# Patient Record
Sex: Male | Born: 1985 | Race: White | Hispanic: No | Marital: Married | Smoking: Former smoker
Health system: Southern US, Community
[De-identification: ages and names within clinical notes are randomized; demographics above are authoritative.]

## PROBLEM LIST (undated history)

## (undated) DIAGNOSIS — K219 Gastro-esophageal reflux disease without esophagitis: Secondary | ICD-10-CM

## (undated) HISTORY — DX: Gastro-esophageal reflux disease without esophagitis: K21.9

---

## 2009-03-09 ENCOUNTER — Emergency Department (HOSPITAL_COMMUNITY): Admission: EM | Admit: 2009-03-09 | Discharge: 2009-03-09 | Payer: Self-pay | Admitting: Emergency Medicine

## 2009-05-10 ENCOUNTER — Ambulatory Visit: Payer: Self-pay | Admitting: Family Medicine

## 2009-05-10 DIAGNOSIS — M5380 Other specified dorsopathies, site unspecified: Secondary | ICD-10-CM | POA: Insufficient documentation

## 2009-06-13 ENCOUNTER — Encounter (INDEPENDENT_AMBULATORY_CARE_PROVIDER_SITE_OTHER): Payer: Self-pay | Admitting: *Deleted

## 2009-06-13 DIAGNOSIS — F17200 Nicotine dependence, unspecified, uncomplicated: Secondary | ICD-10-CM | POA: Insufficient documentation

## 2010-07-11 NOTE — Miscellaneous (Signed)
Summary: [name]  Clinical Lists Changes  Problems: Added new problem of [name] (ICD-305.1) 

## 2010-07-25 ENCOUNTER — Encounter: Payer: Self-pay | Admitting: *Deleted

## 2010-09-15 LAB — CULTURE, ROUTINE-ABSCESS

## 2011-07-11 ENCOUNTER — Other Ambulatory Visit: Payer: Self-pay | Admitting: Family Medicine

## 2018-06-24 ENCOUNTER — Ambulatory Visit (INDEPENDENT_AMBULATORY_CARE_PROVIDER_SITE_OTHER): Payer: 59 | Admitting: Family Medicine

## 2018-06-24 ENCOUNTER — Encounter: Payer: Self-pay | Admitting: Family Medicine

## 2018-06-24 VITALS — BP 122/72 | HR 72 | Temp 98.5°F | Ht 76.0 in | Wt 200.2 lb

## 2018-06-24 DIAGNOSIS — Z0000 Encounter for general adult medical examination without abnormal findings: Secondary | ICD-10-CM | POA: Diagnosis not present

## 2018-06-24 NOTE — Progress Notes (Signed)
Subjective:  Eben Bezio is a 33 y.o. male who presents today for his annual comprehensive physical exam and to establish care.    HPI:  He has no acute complaints today.   Lifestyle Diet: No specific diets.  Exercise: Walks dog regularly.   Depression screen PHQ 2/9 06/24/2018  Decreased Interest 0  Down, Depressed, Hopeless 0  PHQ - 2 Score 0   Health Maintenance Due  Topic Date Due  . HIV Screening  03/28/2001    ROS: A complete review of systems was negative.   PMH:  The following were reviewed and entered/updated in epic: Past Medical History:  Diagnosis Date  . GERD (gastroesophageal reflux disease)    There are no active problems to display for this patient.  History reviewed. No pertinent surgical history.  Family History  Problem Relation Age of Onset  . Diabetes Mother   . Arthritis Father   . Hearing loss Father   . Hypertension Father   . Prostate cancer Neg Hx   . Colon cancer Neg Hx     Medications- reviewed and updated No current outpatient medications on file.   No current facility-administered medications for this visit.     Allergies-reviewed and updated Allergies  Allergen Reactions  . Sulfa Antibiotics Swelling    Social History   Socioeconomic History  . Marital status: Married    Spouse name: Not on file  . Number of children: Not on file  . Years of education: Not on file  . Highest education level: Not on file  Occupational History  . Not on file  Social Needs  . Financial resource strain: Not on file  . Food insecurity:    Worry: Not on file    Inability: Not on file  . Transportation needs:    Medical: Not on file    Non-medical: Not on file  Tobacco Use  . Smoking status: Former Smoker    Types: Cigarettes    Last attempt to quit: 06/24/2013    Years since quitting: 5.0  . Smokeless tobacco: Never Used  Substance and Sexual Activity  . Alcohol use: Yes  . Drug use: Never  . Sexual activity: Yes  Lifestyle   . Physical activity:    Days per week: Not on file    Minutes per session: Not on file  . Stress: Not on file  Relationships  . Social connections:    Talks on phone: Not on file    Gets together: Not on file    Attends religious service: Not on file    Active member of club or organization: Not on file    Attends meetings of clubs or organizations: Not on file    Relationship status: Not on file  Other Topics Concern  . Not on file  Social History Narrative  . Not on file    Objective:  Physical Exam: BP 122/72 (BP Location: Left Arm, Patient Position: Sitting, Cuff Size: Normal)   Pulse 72   Temp 98.5 F (36.9 C) (Oral)   Ht 6\' 4"  (1.93 m)   Wt 200 lb 4 oz (90.8 kg)   SpO2 98%   BMI 24.38 kg/m   Body mass index is 24.38 kg/m. Wt Readings from Last 3 Encounters:  06/24/18 200 lb 4 oz (90.8 kg)   Gen: NAD, resting comfortably HEENT: TMs normal bilaterally. OP clear. No thyromegaly noted.  CV: RRR with no murmurs appreciated Pulm: NWOB, CTAB with no crackles, wheezes, or rhonchi GI: Normal  bowel sounds present. Soft, Nontender, Nondistended. MSK: no edema, cyanosis, or clubbing noted Skin: warm, dry Neuro: CN2-12 grossly intact. Strength 5/5 in upper and lower extremities. Reflexes symmetric and intact bilaterally.  Psych: Normal affect and thought content  Assessment/Plan:  Healthy 33 year old man.  Patient Counseling(The following topics were reviewed and/or handout was given):  -Nutrition: Stressed importance of moderation in sodium/caffeine intake, saturated fat and cholesterol, caloric balance, sufficient intake of fresh fruits, vegetables, and fiber.  -Stressed the importance of regular exercise.   -Substance Abuse: Discussed cessation/primary prevention of tobacco, alcohol, or other drug use; driving or other dangerous activities under the influence; availability of treatment for abuse.   -Injury prevention: Discussed safety belts, safety helmets, smoke  detector, smoking near bedding or upholstery.   -Sexuality: Discussed sexually transmitted diseases, partner selection, use of condoms, avoidance of unintended pregnancy and contraceptive alternatives.   -Dental health: Discussed importance of regular tooth brushing, flossing, and dental visits.  -Health maintenance and immunizations reviewed. Please refer to Health maintenance section.  Return to care in 1 year for next preventative visit.   [name]. Jimmey Ralph, MD 06/24/2018 3:44 PM

## 2018-06-24 NOTE — Patient Instructions (Signed)
It was very nice to see you today!  Keep up the good work!  Come back in a year or so for your next physical, or sooner as needed.   Take care, Dr Jerline Pain   Preventive Care 18-39 Years, Male Preventive care refers to lifestyle choices and visits with your health care provider that can promote health and wellness. What does preventive care include?   A yearly physical exam. This is also called an annual well check.  Dental exams once or twice a year.  Routine eye exams. Ask your health care provider how often you should have your eyes checked.  Personal lifestyle choices, including: ? Daily care of your teeth and gums. ? Regular physical activity. ? Eating a healthy diet. ? Avoiding tobacco and drug use. ? Limiting alcohol use. ? Practicing safe sex. What happens during an annual well check? The services and screenings done by your health care provider during your annual well check will depend on your age, overall health, lifestyle risk factors, and family history of disease. Counseling Your health care provider may ask you questions about your:  Alcohol use.  Tobacco use.  Drug use.  Emotional well-being.  Home and relationship well-being.  Sexual activity.  Eating habits.  Work and work Statistician. Screening You may have the following tests or measurements:  Height, weight, and BMI.  Blood pressure.  Lipid and cholesterol levels. These may be checked every 5 years starting at age 84.  Diabetes screening. This is done by checking your blood sugar (glucose) after you have not eaten for a while (fasting).  Skin check.  Hepatitis C blood test.  Hepatitis B blood test.  Sexually transmitted disease (STD) testing. Discuss your test results, treatment options, and if necessary, the need for more tests with your health care provider. Vaccines Your health care provider may recommend certain vaccines, such as:  Influenza vaccine. This is recommended every  year.  Tetanus, diphtheria, and acellular pertussis (Tdap, Td) vaccine. You may need a Td booster every 10 years.  Varicella vaccine. You may need this if you have not been vaccinated.  HPV vaccine. If you are 20 or younger, you may need three doses over 6 months.  Measles, mumps, and rubella (MMR) vaccine. You may need at least one dose of MMR.You may also need a second dose.  Pneumococcal 13-valent conjugate (PCV13) vaccine. You may need this if you have certain conditions and have not been vaccinated.  Pneumococcal polysaccharide (PPSV23) vaccine. You may need one or two doses if you smoke cigarettes or if you have certain conditions.  Meningococcal vaccine. One dose is recommended if you are age 36-21 years and a first-year college student living in a residence hall, or if you have one of several medical conditions. You may also need additional booster doses.  Hepatitis A vaccine. You may need this if you have certain conditions or if you travel or work in places where you may be exposed to hepatitis A.  Hepatitis B vaccine. You may need this if you have certain conditions or if you travel or work in places where you may be exposed to hepatitis B.  Haemophilus influenzae type b (Hib) vaccine. You may need this if you have certain risk factors. Talk to your health care provider about which screenings and vaccines you need and how often you need them. This information is not intended to replace advice given to you by your health care provider. Make sure you discuss any questions you have with  your health care provider. Document Released: 07/24/2001 Document Revised: 01/08/2017 Document Reviewed: 03/29/2015 Elsevier Interactive Patient Education  2019 [employer].

## 2018-07-10 ENCOUNTER — Ambulatory Visit (INDEPENDENT_AMBULATORY_CARE_PROVIDER_SITE_OTHER): Payer: 59 | Admitting: Family Medicine

## 2018-07-10 ENCOUNTER — Encounter: Payer: Self-pay | Admitting: Family Medicine

## 2018-07-10 VITALS — BP 124/78 | HR 76 | Temp 97.7°F | Ht 76.0 in | Wt 208.2 lb

## 2018-07-10 DIAGNOSIS — R41840 Attention and concentration deficit: Secondary | ICD-10-CM | POA: Diagnosis not present

## 2018-07-10 DIAGNOSIS — F329 Major depressive disorder, single episode, unspecified: Secondary | ICD-10-CM

## 2018-07-10 DIAGNOSIS — R4589 Other symptoms and signs involving emotional state: Secondary | ICD-10-CM

## 2018-07-10 DIAGNOSIS — F439 Reaction to severe stress, unspecified: Secondary | ICD-10-CM

## 2018-07-10 LAB — COMPREHENSIVE METABOLIC PANEL
ALT: 27 U/L (ref 0–53)
AST: 19 U/L (ref 0–37)
Albumin: 4.6 g/dL (ref 3.5–5.2)
Alkaline Phosphatase: 53 U/L (ref 39–117)
BUN: 12 mg/dL (ref 6–23)
CALCIUM: 9.7 mg/dL (ref 8.4–10.5)
CO2: 28 mEq/L (ref 19–32)
Chloride: 103 mEq/L (ref 96–112)
Creatinine, Ser: 0.82 mg/dL (ref 0.40–1.50)
GFR: 108.69 mL/min (ref >60.00)
Glucose, Bld: 80 mg/dL (ref 70–99)
POTASSIUM: 3.9 meq/L (ref 3.5–5.1)
SODIUM: 138 meq/L (ref 135–145)
TOTAL PROTEIN: 6.9 g/dL (ref 6.0–8.3)
Total Bilirubin: 0.8 mg/dL (ref 0.2–1.2)

## 2018-07-10 LAB — TSH: TSH: 1.18 u[IU]/mL (ref 0.35–4.50)

## 2018-07-10 LAB — CBC
HEMATOCRIT: 44.9 % (ref 39.0–52.0)
Hemoglobin: 15.4 g/dL (ref 13.0–17.0)
MCHC: 34.2 g/dL (ref 30.0–36.0)
MCV: 86.1 fl (ref 78.0–100.0)
Platelets: 172 10*3/uL (ref 150.0–400.0)
RBC: 5.22 Mil/uL (ref 4.22–5.81)
RDW: 12.7 % (ref 11.5–15.5)
WBC: 4.2 10*3/uL (ref 4.0–10.5)

## 2018-07-10 NOTE — Patient Instructions (Signed)
It was very nice to see you today!  We will check blood work today.   I will also place a referral to psychiatry for you to have testing done for adult ADD.  I am happy to prescribe your medications if you need any once you are on a stable dose.   Take care, Dr Jimmey Ralph

## 2018-07-10 NOTE — Progress Notes (Signed)
   Chief Complaint:  Jason Murray is a 33 y.o. male who presents today with a chief complaint of inattention.   Assessment/Plan:  Inattention Likely some component of depression/anxiety.  He does have a childhood history of ADD which could be contributing as well.  We will check CBC, CMAC, and TSH to rule out organic etiologies today.  Will place referral to psychiatry for further evaluation.  Discussed reasons to return to care.   Subjective:  HPI:  Inattention Started about a month ago.  They have been stable over that time.  He recently moved to the area from [place] and has been under quite a bit of life stress including the new move.  He has a new baby due in July and is currently living with his in-laws.  He has additionally not happy with his current work situation and is looking for new jobs however has not found anything yet.  Patient notes that he has had some difficulty processing information over the past month.  Also he is frequently forgetful about small things such as the locations of certain items.  Wife also complains that he has been much more forgetful lately.  As a child he was diagnosed with ADHD.  He has not been on any medications for this for several years.  No fevers or chills.  No constipation or diarrhea.  No hot or cold intolerance.  Depression screen [facility] 2/9 07/10/2018  Decreased Interest 2  Down, Depressed, Hopeless 0  PHQ - 2 Score 2  Altered sleeping 1  Tired, decreased energy 3  Change in appetite 0  Feeling bad or failure about yourself  0  Trouble concentrating 1  Moving slowly or fidgety/restless 0  Suicidal thoughts 0  PHQ-9 Score 7    GAD 7 : Generalized Anxiety Score 07/10/2018  Nervous, Anxious, on Edge 1  Control/stop worrying 0  Worry too much - different things 3  Trouble relaxing 0  Restless 0  Easily annoyed or irritable 1  Afraid - awful might happen 0  Total GAD 7 Score 5  Anxiety Difficulty Somewhat difficult    ROS: No SI or  HI.    ROS: Per HPI  PMH: He reports that he quit smoking about 5 years ago. His smoking use included cigarettes. He has never used smokeless tobacco. He reports current alcohol use. He reports that he does not use drugs.      Objective:  Physical Exam: BP 124/78 (BP Location: Left Arm, Patient Position: Sitting, Cuff Size: Normal)   Pulse 76   Temp 97.7 F (36.5 C) (Oral)   Ht 6\' 4"  (1.93 m)   Wt 208 lb 4 oz (94.5 kg)   SpO2 99%   BMI 25.35 kg/m   Gen: NAD, resting comfortably CV: Regular rate and rhythm with no murmurs appreciated Pulm: Normal work of breathing, clear to auscultation bilaterally with no crackles, wheezes, or rhonchi Skin: Warm, dry Neuro: Grossly normal, moves all extremities.  Normal mini cog test. Psych: Normal affect and thought content     Time Spent: I spent >15 minutes face-to-face with the patient, with more than half spent on counseling for management plan for his difficulty with attention, depressed mood, and stress.  [name]. Jimmey Ralph, MD 07/10/2018 8:58 AM

## 2018-07-11 NOTE — Progress Notes (Signed)
Please inform patient of the following:  His blood work is all NORMAL. Would like for him to follow up with psychiatry as we discussed at his office visit.  [name]. Jimmey Ralph, MD 07/11/2018 8:00 AM

## 2018-07-24 ENCOUNTER — Encounter (HOSPITAL_COMMUNITY): Payer: Self-pay | Admitting: Psychiatry

## 2018-07-24 ENCOUNTER — Ambulatory Visit (INDEPENDENT_AMBULATORY_CARE_PROVIDER_SITE_OTHER): Payer: 59 | Admitting: Psychiatry

## 2018-07-24 VITALS — BP 134/83 | HR 82 | Ht 76.0 in | Wt 210.0 lb

## 2018-07-24 DIAGNOSIS — F909 Attention-deficit hyperactivity disorder, unspecified type: Secondary | ICD-10-CM | POA: Diagnosis not present

## 2018-07-24 MED ORDER — AMPHETAMINE-DEXTROAMPHETAMINE 10 MG PO TABS: 10.0000 mg | ORAL_TABLET | Freq: Two times a day (BID) | ORAL | 0 refills | Status: DC

## 2018-07-24 NOTE — Progress Notes (Signed)
Psychiatric Initial Adult Assessment   Patient Identification: Jason BoxerSteven Murray MRN:  [id number] Date of Evaluation:  07/24/2018 Referral Source: Jacquiline Doealeb Parker MD Chief Complaint:  Lack of motivation, forgetfulness Visit Diagnosis:    ICD-10-CM   1. Adult ADHD F90.9     History of Present Illness:  33 yo married male with long hx of ADD who has been experiencing low motivation, apathy, problem focusing, forgetfulness and in general having difficulty getting things done. This cases him to worry, ruminate experience difficulty falling asleep from time to time. He has been diagnosed with ADD in 8th grade in elementary school. He was prescribed Adderall but experienced suppression of appetite and did not like that much. He later tried Adderall XR as an adult and liked it much less as he felt that his energy level (mental and physical) was going up and down every few hours throughout the day. He denies being depressed, having hx of mania, psychosis. He does not abuse alcohol or street drugs. He was never psychiatrically hospitalized. His sister also has sx of ADD but he is not sure if she is on a medication and if so on which one. At this time he is open to another trial of a medciation for ADD providing it is not Adderall XR.  Associated Signs/Symptoms: Depression Symptoms:  fatigue, difficulty concentrating, impaired memory, anxiety, (Hypo) Manic Symptoms:  none Anxiety Symptoms:  Excessive Worry, Psychotic Symptoms:  none PTSD Symptoms: Negative  Past Psychiatric History: see above  Previous Psychotropic Medications: Yes   Substance Abuse History in the last 12 months:  No.  Consequences of Substance Abuse: Negative  Past Medical History:  Past Medical History:  Diagnosis Date  . GERD (gastroesophageal reflux disease)    No past surgical history on file.  Family Psychiatric History: [place] ADD  Family History:  Family History  Problem Relation Age of Onset  . Diabetes Mother    . Arthritis Father   . Hearing loss Father   . Hypertension Father   . Prostate cancer Neg Hx   . Colon cancer Neg Hx     Social History:   Social History   Socioeconomic History  . Marital status: Married    Spouse name: Not on file  . Number of children: Not on file  . Years of education: Not on file  . Highest education level: Not on file  Occupational History  . Not on file  Social Needs  . Financial resource strain: Not on file  . Food insecurity:    Worry: Not on file    Inability: Not on file  . Transportation needs:    Medical: Not on file    Non-medical: Not on file  Tobacco Use  . Smoking status: Former Smoker    Types: Cigarettes    Last attempt to quit: 06/24/2013    Years since quitting: 5.0  . Smokeless tobacco: Never Used  Substance and Sexual Activity  . Alcohol use: Yes  . Drug use: Never  . Sexual activity: Yes  Lifestyle  . Physical activity:    Days per week: Not on file    Minutes per session: Not on file  . Stress: Not on file  Relationships  . Social connections:    Talks on phone: Not on file    Gets together: Not on file    Attends religious service: Not on file    Active member of club or organization: Not on file    Attends meetings of clubs or organizations:  Not on file    Relationship status: Not on file  Other Topics Concern  . Not on file  Social History Narrative  . Not on file    Additional Social History: Married, expecting his first child in [place]. He served in [employer], no combat involvement. 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 Changed jobs few times, does not blame self for that.   Allergies:   Allergies  Allergen Reactions  . Sulfa Antibiotics Swelling    Metabolic Disorder Labs: No results found for: HGBA1C, MPG No results found for: PROLACTIN No results found for: CHOL, TRIG, HDL, CHOLHDL, VLDL, LDLCALC Lab Results  Component Value Date   TSH 1.18 07/10/2018     Therapeutic Level Labs: No results found for: LITHIUM No results found for: CBMZ No results found for: VALPROATE  Current Medications: Current Outpatient Medications  Medication Sig Dispense Refill  . ibuprofen (ADVIL,MOTRIN) 200 MG tablet Take 200 mg by mouth every 6 (six) hours as needed.     No current facility-administered medications for this visit.     Musculoskeletal: Strength & Muscle Tone: within normal limits Gait & Station: normal Patient leans: N/A  Psychiatric Specialty Exam: Review of Systems  Constitutional: Negative.   HENT: Negative.   Eyes: Negative.   Respiratory: Negative.   Cardiovascular: Negative.   Gastrointestinal: Negative.   Genitourinary: Negative.   Musculoskeletal: Negative.   Skin: Negative.   Neurological: Negative.   Endo/Heme/Allergies: Negative.   Psychiatric/Behavioral: The patient is nervous/anxious.     Blood pressure 134/83, pulse 82, height 6\' 4"  (1.93 m), weight 210 lb (95.3 kg), SpO2 97 %.Body mass index is 25.56 kg/m.  General Appearance: Casual and Fairly Groomed  Eye Contact:  Good  Speech:  Clear and Coherent  Volume:  Normal  Mood:  Anxious  Affect:  Constricted  Thought Process:  Goal Directed  Orientation:  Full (Time, Place, and Person)  Thought Content:  Logical  Suicidal Thoughts:  No  Homicidal Thoughts:  No  Memory:  Immediate;   Fair Recent;   Fair Remote;   Fair  Judgement:  Fair  Insight:  Fair  Psychomotor Activity:  Decreased  Concentration:  Concentration: Fair  Recall:  Fair  Fund of Knowledge:Good  Language: Good  Akathisia:  Negative  Handed:  Right  AIMS (if indicated):  not done  Assets:  Desire for Improvement Housing Intimacy Leisure Time Physical Health Vocational/Educational  ADL's:  Intact  Cognition: WNL  Sleep:  Fair   Screenings: GAD-7     Office Visit from 07/10/2018 in [facility]  Total GAD-7 Score  5    PHQ2-9     Office Visit from  07/10/2018 in [facility] from 06/24/2018 in [facility]  PHQ-2 Total Score  2  0  PHQ-9 Total Score  7  -      Assessment and Plan: 33 yo married male with long hx of sx 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 Some anxiety present likely associated with lack of focus, questions about future performance on a job. Chaged jobs few times in the past - unclear how his focusing problems contributed to that. He completed [place] Symptom Checklist for Adult ADD - scored highest (67%) on inattention/distractibility and Underactivity (44%) subscales - consistent with severe and moderate level of difficulties, respectively. No impulsivity or hyperactivity described or observed. We have discussed various options of treating ADD including  stimulants (amphetamine, methylphenidate), atomoxetine and bupropion. Jason Murray decided to try Adderall (immediate release ) again. He will follow titration protocol I wrote for him. After trial period we will decide on the dose and schedule during his follow up visit.  Impression: Attention deficit disorder  The plan was discussed with patient. I spend 60 minutes in direct face to face clinical contact with the patient and devoted approximately 50% of this time to explanation of diagnosis, discussion of treatment options and med education. Return to clinic in 3-4 weeks.  Magdalene Patricialgierd A Timoty Bourke, MD 2/13/20203:35 PM

## 2018-07-24 NOTE — Patient Instructions (Signed)
Plan:  !. You are starting of an Adderall titration to find the effective and well tolerated dose. Chose a task with which you consistently have some difficulty and judge how much easier it will be for you to complete on any given dose of Adderall. Please also note any side effects you might experience.  2. Day 1-5 - take 1/2 tablet with breakfast and 1/2 with lunch (around 12 - 1 PM) 3. Days 6-10 - take a whole 10 mg tablet in AM and one at noon, 4. Days 11-15 - take 1 and 1/2 tablet in AM and 1 and 1/2 at noon 5. Days 16-20 - take 2 tablets in AM and 2 at noon.  If you experience bothersome side effects after you increase the dose and they do not resolve within 2-3 days, decrease dose to the previous one.

## 2018-08-21 ENCOUNTER — Other Ambulatory Visit: Payer: Self-pay

## 2018-08-21 ENCOUNTER — Ambulatory Visit (INDEPENDENT_AMBULATORY_CARE_PROVIDER_SITE_OTHER): Payer: 59 | Admitting: Psychiatry

## 2018-08-21 ENCOUNTER — Encounter (HOSPITAL_COMMUNITY): Payer: Self-pay | Admitting: Psychiatry

## 2018-08-21 VITALS — BP 140/78 | Ht 76.0 in | Wt 206.0 lb

## 2018-08-21 DIAGNOSIS — F909 Attention-deficit hyperactivity disorder, unspecified type: Secondary | ICD-10-CM

## 2018-08-21 MED ORDER — AMPHETAMINE-DEXTROAMPHETAMINE 30 MG PO TABS: 30.0000 mg | ORAL_TABLET | Freq: Every day | ORAL | 0 refills | Status: DC

## 2018-08-21 NOTE — Progress Notes (Signed)
BH MD/PA/NP OP Progress Note  08/21/2018 3:18 PM Jason Murray  MRN:  [id number]  Chief Complaint: "I'm doing better on 25 mg". 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 Some anxiety present likely associated with lack of focus, questions about future performance on a job. Chaged jobs few times in the past - unclear how his focusing problems contributed to that. He completed [facility] Symptom Checklist for Adult ADD - scored highest (67%) on inattention/distractibility and Underactivity (44%) subscales - consistent with severe and moderate level of difficulties, respectively. No impulsivity or hyperactivity described or observed. We have discussed various options of treating ADD including stimulants (amphetamine, methylphenidate), atomoxetine and bupropion. Regenald decided to try Adderall (immediate release ) again. He tested various doses on a bid schedule and realized that 25 mg daily works well enough for ADD symptoms all day long but he cannot take even 10 mg at noon as he cannot fall asleep later.  Visit Diagnosis:    ICD-10-CM   1. Adult ADHD F90.9     Past Psychiatric History: Please see initial H&P.  Past Medical History:  Past Medical History:  Diagnosis Date  . GERD (gastroesophageal reflux disease)    History reviewed. No pertinent surgical history.  Family Psychiatric History: No psychiatric issues reported.  Family History:  Family History  Problem Relation Age of Onset  . Diabetes Mother   . Arthritis Father   . Hearing loss Father   . Hypertension Father   . Prostate cancer Neg Hx   . Colon cancer Neg Hx     Social History:  Social History   Socioeconomic History  . Marital status: Married    Spouse name: Not on file  . Number of children: Not on file  . Years of education: Not on file  . Highest education level: Not on file  Occupational History  . Not on file  Social Needs  . Financial  resource strain: Not on file  . Food insecurity:    Worry: Not on file    Inability: Not on file  . Transportation needs:    Medical: Not on file    Non-medical: Not on file  Tobacco Use  . Smoking status: Former Smoker    Types: Cigarettes    Last attempt to quit: 06/24/2013    Years since quitting: 5.1  . Smokeless tobacco: Never Used  Substance and Sexual Activity  . Alcohol use: Yes  . Drug use: Never  . Sexual activity: Yes  Lifestyle  . Physical activity:    Days per week: Not on file    Minutes per session: Not on file  . Stress: Not on file  Relationships  . Social connections:    Talks on phone: Not on file    Gets together: Not on file    Attends religious service: Not on file    Active member of club or organization: Not on file    Attends meetings of clubs or organizations: Not on file    Relationship status: Not on file  Other Topics Concern  . Not on file  Social History Narrative  . Not on file    Allergies:  Allergies  Allergen Reactions  . Sulfa Antibiotics Swelling    Metabolic Disorder Labs: No results found for: HGBA1C, MPG No results found for: PROLACTIN No results found for: CHOL, TRIG, HDL, CHOLHDL, VLDL, LDLCALC Lab Results  Component Value Date   TSH 1.18 07/10/2018  Therapeutic Level Labs: No results found for: LITHIUM No results found for: VALPROATE No components found for:  CBMZ  Current Medications: Current Outpatient Medications  Medication Sig Dispense Refill  . amphetamine-dextroamphetamine (ADDERALL) 30 MG tablet Take 1 tablet by mouth daily with breakfast for 30 days. 30 tablet 0  . ibuprofen (ADVIL,MOTRIN) 200 MG tablet Take 200 mg by mouth every 6 (six) hours as needed.     No current facility-administered medications for this visit.      Musculoskeletal: Strength & Muscle Tone: within normal limits Gait & Station: normal Patient leans: N/A  Psychiatric Specialty Exam: Review of Systems  Constitutional:  Negative.   HENT: Negative.   Eyes: Negative.   Respiratory: Negative.   Cardiovascular: Negative.   Gastrointestinal: Negative.   Genitourinary: Negative.   Musculoskeletal: Negative.   Skin: Negative.   Neurological: Negative.   Endo/Heme/Allergies: Negative.   Psychiatric/Behavioral: Negative.     Blood pressure 140/78, height 6\' 4"  (1.93 m), weight 206 lb (93.4 kg).Body mass index is 25.08 kg/m.  General Appearance: Casual  Eye Contact:  Good  Speech:  Clear and Coherent  Volume:  Normal  Mood:  Euthymic  Affect:  Constricted  Thought Process:  Goal Directed  Orientation:  Full (Time, Place, and Person)  Thought Content: Logical   Suicidal Thoughts:  No  Homicidal Thoughts:  No  Memory:  Immediate;   Fair Recent;   Good Remote;   Good  Judgement:  Intact  Insight:  Fair  Psychomotor Activity:  Normal  Concentration:  Concentration: Fair  Recall:  Fair  Fund of Knowledge: Good  Language: Good  Akathisia:  Negative  Handed:  Right  AIMS (if indicated): not done  Assets:  Desire for Improvement Housing Physical Health  ADL's:  Intact  Cognition: WNL  Sleep:  Good   Screenings: GAD-7     Office Visit from 07/10/2018 in [facility]  Total GAD-7 Score  5    PHQ2-9     Office Visit from 07/10/2018 in [facility] Visit from 06/24/2018 in [facility]  PHQ-2 Total Score  2  0  PHQ-9 Total Score  7  -       Assessment and Plan: 33 yo married male with 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 According to [facility] Symptom Checklist for Adult ADD -he scored highest (67%) on inattention/distractibility and Underactivity (44%) subscales - consistent with severe and moderate level of difficulties, respectively. No impulsivity or hyperactivity described or observed. We have discussed various options of treating ADD including stimulants (amphetamine, methylphenidate),  atomoxetine and bupropion. He tested various doses of regular Adderall on a bid schedule and realized that 25 mg daily works well enough for ADD symptoms all day long but he cannot take even 10 mg at noon as he cannot fall asleep later. We will try 30 mg IR daily, if it proves too high a diose we can change it to 12.5 mg strength tablets two at a time. Jason Murray will return for next visit in 3 months and I will be sending refills for his Rx over next two months when he requests it.   Magdalene Patricia, MD 08/21/2018, 3:18 PM

## 2018-09-22 ENCOUNTER — Telehealth (HOSPITAL_COMMUNITY): Payer: Self-pay

## 2018-09-22 ENCOUNTER — Other Ambulatory Visit (HOSPITAL_COMMUNITY): Payer: Self-pay | Admitting: Psychiatry

## 2018-09-22 MED ORDER — AMPHETAMINE-DEXTROAMPHETAMINE 30 MG PO TABS: 30.0000 mg | ORAL_TABLET | Freq: Every day | ORAL | 0 refills | Status: DC

## 2018-09-22 NOTE — Telephone Encounter (Signed)
Medication refill request - Telephone message received from patient he is in need of a new Adderall order, last provided 08/21/18.

## 2018-09-23 NOTE — Telephone Encounter (Signed)
Medication refill request - Telephone call with patient to inform Dr. Hinton Dyer had sent in his requested refill of Adderall.

## 2018-09-23 NOTE — Telephone Encounter (Signed)
Ordered Adderall refill yesterday.  OP

## 2018-10-27 ENCOUNTER — Telehealth (HOSPITAL_COMMUNITY): Payer: Self-pay

## 2018-10-27 MED ORDER — AMPHETAMINE-DEXTROAMPHETAMINE 30 MG PO TABS: 30.0000 mg | ORAL_TABLET | Freq: Every day | ORAL | 0 refills | Status: DC

## 2018-10-27 NOTE — Telephone Encounter (Signed)
done

## 2018-10-27 NOTE — Telephone Encounter (Signed)
Jason Murray patient - patient is calling for a refill on his Adderall, he uses [facility]

## 2018-11-20 ENCOUNTER — Ambulatory Visit (INDEPENDENT_AMBULATORY_CARE_PROVIDER_SITE_OTHER): Payer: 59 | Admitting: Psychiatry

## 2018-11-20 ENCOUNTER — Ambulatory Visit (HOSPITAL_COMMUNITY): Payer: 59 | Admitting: Psychiatry

## 2018-11-20 ENCOUNTER — Other Ambulatory Visit: Payer: Self-pay

## 2018-11-20 DIAGNOSIS — F909 Attention-deficit hyperactivity disorder, unspecified type: Secondary | ICD-10-CM

## 2018-11-20 MED ORDER — AMPHETAMINE-DEXTROAMPHETAMINE 30 MG PO TABS: 30.0000 mg | ORAL_TABLET | Freq: Every day | ORAL | 0 refills | Status: DC

## 2018-11-20 NOTE — Progress Notes (Signed)
BH MD/PA/NP OP Progress Note  11/20/2018 3:45 PM Jason Murray  MRN:  [id number] Interview was conducted by phone and I verified that I was speaking with the correct person using two identifiers. I discussed the limitations of evaluation and management by telemedicine and  the availability of in person appointments. Patient expressed understanding and agreed to proceed.  Chief Complaint: "I have been doing well".  HPI: 33 yo married male with 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 We have discussed various options of treating ADD including stimulants (amphetamine, methylphenidate), atomoxetine and bupropion. Jason Jason Murray tested various doses of regular Adderall on a bid schedule and realized that 25 mg daily works well enough for ADD symptoms all day long. He cannot take even 10 mg at noon as he cannot fall asleep later. We tried 30 mg IR daily and Jason Jason Murray reports to have adjusted to this dose which now works well without interfering with sleep. Mood neutral, appetite normal.   Visit Diagnosis:    ICD-10-CM   1. Adult ADHD  F90.9     Past Psychiatric History: Please refer to intake H&P.  Past Medical History:  Past Medical History:  Diagnosis Date  . GERD (gastroesophageal reflux disease)    No past surgical history on file.  Family Psychiatric History: Reviewed.  Family History:  Family History  Problem Relation Age of Onset  . Diabetes Mother   . Arthritis Father   . Hearing loss Father   . Hypertension Father   . Prostate cancer Neg Hx   . Colon cancer Neg Hx     Social History:  Social History   Socioeconomic History  . Marital status: Married    Spouse name: Not on file  . Number of children: Not on file  . Years of education: Not on file  . Highest education level: Not on file  Occupational History  . Not on file  Social Needs  . Financial resource strain: Not on file  . Food insecurity    Worry: Not on file    Inability: Not on file  .  Transportation needs    Medical: Not on file    Non-medical: Not on file  Tobacco Use  . Smoking status: Former Smoker    Types: Cigarettes    Quit date: 06/24/2013    Years since quitting: 5.4  . Smokeless tobacco: Never Used  Substance and Sexual Activity  . Alcohol use: Yes  . Drug use: Never  . Sexual activity: Yes  Lifestyle  . Physical activity    Days per week: Not on file    Minutes per session: Not on file  . Stress: Not on file  Relationships  . Social Musicianconnections    Talks on phone: Not on file    Gets together: Not on file    Attends religious service: Not on file    Active member of club or organization: Not on file    Attends meetings of clubs or organizations: Not on file    Relationship status: Not on file  Other Topics Concern  . Not on file  Social History Narrative  . Not on file    Allergies:  Allergies  Allergen Reactions  . Sulfa Antibiotics Swelling    Metabolic Disorder Labs: No results found for: HGBA1C, MPG No results found for: PROLACTIN No results found for: CHOL, TRIG, HDL, CHOLHDL, VLDL, LDLCALC Lab Results  Component Value Date   TSH 1.18 07/10/2018    Therapeutic Level Labs: No results found  for: LITHIUM No results found for: VALPROATE No components found for:  CBMZ  Current Medications: Current Outpatient Medications  Medication Sig Dispense Refill  . ibuprofen (ADVIL,MOTRIN) 200 MG tablet Take 200 mg by mouth every 6 (six) hours as needed.     No current facility-administered medications for this visit.      Psychiatric Specialty Exam: Review of Systems  All other systems reviewed and are negative.   There were no vitals taken for this visit.There is no height or weight on file to calculate BMI.  General Appearance: NA  Eye Contact:  NA  Speech:  Clear and Coherent  Volume:  Normal  Mood:  Euthymic  Affect:  NA  Thought Process:  Goal Directed  Orientation:  Full (Time, Place, and Person)  Thought Content:  Logical   Suicidal Thoughts:  No  Homicidal Thoughts:  No  Memory:  Immediate;   Good Recent;   Good Remote;   Good  Judgement:  Good  Insight:  Good  Psychomotor Activity:  NA  Concentration:  Concentration: Good and Attention Span: Good  Recall:  Good  Fund of Knowledge: Good  Language: Good  Akathisia:  Negative  Handed:  Right  AIMS (if indicated): not done  Assets:  Communication Skills Desire for Improvement Financial Resources/Insurance Housing Intimacy Physical Health Social Support Vocational/Educational  ADL's:  Intact  Cognition: WNL  Sleep:  Good   Screenings: GAD-7     Office Visit from 07/10/2018 in [place]  Total GAD-7 Score  5    PHQ2-9     Office Visit from 07/10/2018 in [place] Visit from 06/24/2018 in [place]  PHQ-2 Total Score  2  0  PHQ-9 Total Score  7  -       Assessment and Plan: 33 yo married male with 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 We have discussed various options of treating ADD including stimulants (amphetamine, methylphenidate), atomoxetine and bupropion. Jason Murray tested various doses of regular Adderall on a bid schedule and realized that 25 mg daily works well enough for ADD symptoms all day long. He cannot take even 10 mg at noon as he cannot fall asleep later. We tried 30 mg IR daily and Jason Murray reports to have adjusted to this dose which now works well without interfering with sleep. Mood neutral, appetite normal. Jason Murray will return for next visit in 3 months.    Stephanie Acre, MD 11/20/2018, 3:45 PM

## 2019-02-19 ENCOUNTER — Ambulatory Visit (INDEPENDENT_AMBULATORY_CARE_PROVIDER_SITE_OTHER): Payer: 59 | Admitting: Psychiatry

## 2019-02-19 ENCOUNTER — Other Ambulatory Visit: Payer: Self-pay

## 2019-02-19 DIAGNOSIS — F909 Attention-deficit hyperactivity disorder, unspecified type: Secondary | ICD-10-CM | POA: Diagnosis not present

## 2019-02-19 MED ORDER — AMPHETAMINE-DEXTROAMPHETAMINE 30 MG PO TABS: 30.0000 mg | ORAL_TABLET | Freq: Every day | ORAL | 0 refills | Status: DC

## 2019-02-19 NOTE — Progress Notes (Signed)
BH MD/PA/NP OP Progress Note  02/19/2019 3:33 PM Charlann BoxerSteven Posthumus  MRN:  [id number] Interview was conducted by phone and I verified that I was speaking with the correct person using two identifiers. I discussed the limitations of evaluation and management by telemedicine and  the availability of in person appointments. Patient expressed understanding and agreed to proceed.  Chief Complaint: "I am doing well but about to run out of Adderall".  HPI: 33 yo married male with 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  Steventried various doses of IR Adderall on a bid schedule and realized that 25 mg daily works well enough for ADD symptoms all day long. He cannot take even 10 mg at noon as he cannot fall asleep later. We tried 30 mg IR daily and Viviann SpareSteven reports to have adjusted to this dose which now works well without interfering with sleep. Mood neutral, appetite normal.   Visit Diagnosis:    ICD-10-CM   1. 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  Past Medical History:  Past Medical History:  Diagnosis Date  . GERD (gastroesophageal reflux disease)    No past surgical history on file.  Family Psychiatric History: None  Family History:  Family History  Problem Relation Age of Onset  . Diabetes Mother   . Arthritis Father   . Hearing loss Father   . Hypertension Father   . Prostate cancer Neg Hx   . Colon cancer Neg Hx     Social History:  Social History   Socioeconomic History  . Marital status: Married    Spouse name: Not on file  . Number of children: Not on file  . Years of education: Not on file  . Highest education level: Not on file  Occupational History  . Not on file  Social Needs  . Financial resource strain: Not on file  . Food insecurity    Worry: Not on file    Inability: Not on file  . Transportation needs    Medical: Not on file    Non-medical: Not on file  Tobacco Use  . Smoking status: Former Smoker     Types: Cigarettes    Quit date: 06/24/2013    Years since quitting: 5.6  . Smokeless tobacco: Never Used  Substance and Sexual Activity  . Alcohol use: Yes  . Drug use: Never  . Sexual activity: Yes  Lifestyle  . Physical activity    Days per week: Not on file    Minutes per session: Not on file  . Stress: Not on file  Relationships  . Social Musicianconnections    Talks on phone: Not on file    Gets together: Not on file    Attends religious service: Not on file    Active member of club or organization: Not on file    Attends meetings of clubs or organizations: Not on file    Relationship status: Not on file  Other Topics Concern  . Not on file  Social History Narrative  . Not on file    Allergies:  Allergies  Allergen Reactions  . Sulfa Antibiotics Swelling    Metabolic Disorder Labs: No results found for: HGBA1C, MPG No results found for: PROLACTIN No results found for: CHOL, TRIG, HDL, CHOLHDL, VLDL, LDLCALC Lab Results  Component Value Date   TSH 1.18 07/10/2018    Therapeutic Level Labs: No results found for: LITHIUM No results found for: VALPROATE No components found  for:  CBMZ  Current Medications: Current Outpatient Medications  Medication Sig Dispense Refill  . amphetamine-dextroamphetamine (ADDERALL) 30 MG tablet Take 1 tablet by mouth daily before breakfast for 30 days. 30 tablet 0  . amphetamine-dextroamphetamine (ADDERALL) 30 MG tablet Take 1 tablet by mouth daily before breakfast for 30 days. 30 tablet 0  . amphetamine-dextroamphetamine (ADDERALL) 30 MG tablet Take 1 tablet by mouth daily before breakfast for 30 days. 30 tablet 0  . ibuprofen (ADVIL,MOTRIN) 200 MG tablet Take 200 mg by mouth every 6 (six) hours as needed.     No current facility-administered medications for this visit.      Psychiatric Specialty Exam: Review of Systems  All other systems reviewed and are negative.   There were no vitals taken for this visit.There is no height or  weight on file to calculate BMI.  General Appearance: NA  Eye Contact:  NA  Speech:  Clear and Coherent and Normal Rate  Volume:  Normal  Mood:  Euthymic  Affect:  NA  Thought Process:  Goal Directed and Linear  Orientation:  Full (Time, Place, and Person)  Thought Content: Logical   Suicidal Thoughts:  No  Homicidal Thoughts:  No  Memory:  Immediate;   Good Recent;   Good Remote;   Good  Judgement:  Good  Insight:  Good  Psychomotor Activity:  NA  Concentration:  Concentration: Good  Recall:  Good  Fund of Knowledge: Good  Language: Good  Akathisia:  Negative  Handed:  Right  AIMS (if indicated): not done  Assets:  Communication Skills Desire for Improvement Financial Resources/Insurance Housing Physical Health Social Support Vocational/Educational  ADL's:  Intact  Cognition: WNL  Sleep:  Good   Screenings: GAD-7     Office Visit from 07/10/2018 in [place]  Total GAD-7 Score  5    PHQ2-9     Office Visit from 07/10/2018 in [place] Visit from 06/24/2018 in [place]  PHQ-2 Total Score  2  0  PHQ-9 Total Score  7  -       Assessment and Plan: 33 yo married male with 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  Steventried various doses of IR Adderall on a bid schedule and realized that 25 mg daily works well enough for ADD symptoms all day long. He cannot take even 10 mg at noon as he cannot fall asleep later. We tried 30 mg IR daily and Allard reports to have adjusted to this dose which now works well without interfering with sleep. Mood neutral, appetite normal.   Dx: ADD adult  Plan: Continue Adderall 30 mg in AM.  Kayvan will return for next visit in 3 months. The plan was discussed with patient who had an opportunity to ask questions and these were all answered. I spend 15 minutes in phone consultation with the patient.     Stephanie Acre,  MD 02/19/2019, 3:33 PM

## 2019-05-21 ENCOUNTER — Other Ambulatory Visit: Payer: Self-pay

## 2019-05-21 ENCOUNTER — Ambulatory Visit (INDEPENDENT_AMBULATORY_CARE_PROVIDER_SITE_OTHER): Payer: 59 | Admitting: Psychiatry

## 2019-05-21 DIAGNOSIS — F909 Attention-deficit hyperactivity disorder, unspecified type: Secondary | ICD-10-CM

## 2019-05-21 MED ORDER — AMPHETAMINE-DEXTROAMPHETAMINE 30 MG PO TABS: 30.0000 mg | ORAL_TABLET | Freq: Every day | ORAL | 0 refills | Status: DC

## 2019-05-21 NOTE — Progress Notes (Signed)
BH MD/PA/NP OP Progress Note  05/21/2019 3:07 PM Jason Murray  MRN:  [id number] Interview was conducted by phone and I verified that I was speaking with the correct person using two identifiers. I discussed the limitations of evaluation and management by telemedicine and  the availability of in person appointments. Patient expressed understanding and agreed to proceed.  Chief Complaint: "I am doing well".  HPI: 33 yo married male with 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 Steventried various doses of IR Adderall on a bid schedule and realized that 25 mg daily works well enough for ADD symptoms all day long. Hecannot take even 10 mg at noon as he cannot fall asleep later. We tried30 mg IR daily and Aemon reports to have adjusted to this dose which now works well without interfering with sleep. Mood neutral, appetite normal.Goes to work (outside job) daily and stays focused all day long.   Visit Diagnosis:    ICD-10-CM   1. 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  Past Medical History:  Past Medical History:  Diagnosis Date  . GERD (gastroesophageal reflux disease)    No past surgical history on file.  Family Psychiatric History: None.  Family History:  Family History  Problem Relation Age of Onset  . Diabetes Mother   . Arthritis Father   . Hearing loss Father   . Hypertension Father   . Prostate cancer Neg Hx   . Colon cancer Neg Hx     Social History:  Social History   Socioeconomic History  . Marital status: Married    Spouse name: Not on file  . Number of children: Not on file  . Years of education: Not on file  . Highest education level: Not on file  Occupational History  . Not on file  Tobacco Use  . Smoking status: Former Smoker    Types: Cigarettes    Quit date: 06/24/2013    Years since quitting: 5.9  . Smokeless tobacco: Never Used  Substance and Sexual Activity  . Alcohol use: Yes  . Drug  use: Never  . Sexual activity: Yes  Other Topics Concern  . Not on file  Social History Narrative  . Not on file   Social Determinants of Health   Financial Resource Strain:   . Difficulty of Paying Living Expenses: Not on file  Food Insecurity:   . Worried About Programme researcher, broadcasting/film/video in the Last Year: Not on file  . Ran Out of Food in the Last Year: Not on file  Transportation Needs:   . Lack of Transportation (Medical): Not on file  . Lack of Transportation (Non-Medical): Not on file  Physical Activity:   . Days of Exercise per Week: Not on file  . Minutes of Exercise per Session: Not on file  Stress:   . Feeling of Stress : Not on file  Social Connections:   . Frequency of Communication with Friends and Family: Not on file  . Frequency of Social Gatherings with Friends and Family: Not on file  . Attends Religious Services: Not on file  . Active Member of Clubs or Organizations: Not on file  . Attends Banker Meetings: Not on file  . Marital Status: Not on file    Allergies:  Allergies  Allergen Reactions  . Sulfa Antibiotics Swelling    Metabolic Disorder Labs: No results found for: HGBA1C, MPG No results found for: PROLACTIN No results found  for: CHOL, TRIG, HDL, CHOLHDL, VLDL, LDLCALC Lab Results  Component Value Date   TSH 1.18 07/10/2018    Therapeutic Level Labs: No results found for: LITHIUM No results found for: VALPROATE No components found for:  CBMZ  Current Medications: Current Outpatient Medications  Medication Sig Dispense Refill  . [START ON 07/27/2019] amphetamine-dextroamphetamine (ADDERALL) 30 MG tablet Take 1 tablet by mouth daily before breakfast. 30 tablet 0  . [START ON 06/26/2019] amphetamine-dextroamphetamine (ADDERALL) 30 MG tablet Take 1 tablet by mouth daily before breakfast. 30 tablet 0  . [START ON 05/26/2019] amphetamine-dextroamphetamine (ADDERALL) 30 MG tablet Take 1 tablet by mouth daily before breakfast. 30 tablet 0   . ibuprofen (ADVIL,MOTRIN) 200 MG tablet Take 200 mg by mouth every 6 (six) hours as needed.     No current facility-administered medications for this visit.     Psychiatric Specialty Exam: Review of Systems  All other systems reviewed and are negative.   There were no vitals taken for this visit.There is no height or weight on file to calculate BMI.  General Appearance: NA  Eye Contact:  NA  Speech:  Clear and Coherent and Normal Rate  Volume:  Normal  Mood:  Euthymic  Affect:  NA  Thought Process:  Goal Directed and Linear  Orientation:  Full (Time, Place, and Person)  Thought Content: Logical   Suicidal Thoughts:  No  Homicidal Thoughts:  No  Memory:  Immediate;   Good Recent;   Good Remote;   Good  Judgement:  Good  Insight:  Good  Psychomotor Activity:  NA  Concentration:  Concentration: Good  Recall:  Good  Fund of Knowledge: Good  Language: Good  Akathisia:  Negative  Handed:  Right  AIMS (if indicated): not done  Assets:  Agricultural consultant Housing Intimacy Physical Health Social Support Talents/Skills  ADL's:  Intact  Cognition: WNL  Sleep:  Good   Screenings: GAD-7     Office Visit from 07/10/2018 in [facility]  Total GAD-7 Score  5    PHQ2-9     Office Visit from 07/10/2018 in [place] Visit from 06/24/2018 in [place]  PHQ-2 Total Score  2  0  PHQ-9 Total Score  7  --       Assessment and Plan: 33 yo married male with 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 Steventried various doses of IR Adderall on a bid schedule and realized that 25 mg daily works well enough for ADD symptoms all day long. Hecannot take even 10 mg at noon as he cannot fall asleep later. We tried30 mg IR daily and Javarus reports to have adjusted to this dose which now works well without interfering with sleep. Mood neutral, appetite  normal.Goes to work (outside job) daily and stays focused all day long.   Dx: ADD adult  Plan: Continue Adderall 30 mg in AM.  [place] will return for next visit in 3 months. The plan was discussed with patient who had an opportunity to ask questions and these were all answered. I spend 15 minutes in phone consultation with the patient.     Stephanie Acre, MD 05/21/2019, 3:07 PM

## 2019-08-24 ENCOUNTER — Other Ambulatory Visit: Payer: Self-pay

## 2019-08-24 ENCOUNTER — Other Ambulatory Visit (HOSPITAL_COMMUNITY): Payer: Self-pay | Admitting: Psychiatry

## 2019-08-24 ENCOUNTER — Ambulatory Visit (HOSPITAL_COMMUNITY): Payer: 59 | Admitting: Psychiatry

## 2019-08-24 MED ORDER — AMPHETAMINE-DEXTROAMPHETAMINE 30 MG PO TABS: 30.0000 mg | ORAL_TABLET | Freq: Every day | ORAL | 0 refills | Status: DC

## 2019-08-27 ENCOUNTER — Other Ambulatory Visit: Payer: Self-pay

## 2019-08-27 ENCOUNTER — Ambulatory Visit (INDEPENDENT_AMBULATORY_CARE_PROVIDER_SITE_OTHER): Payer: 59 | Admitting: Psychiatry

## 2019-08-27 DIAGNOSIS — F909 Attention-deficit hyperactivity disorder, unspecified type: Secondary | ICD-10-CM

## 2019-08-27 MED ORDER — AMPHETAMINE-DEXTROAMPHETAMINE 30 MG PO TABS: 30.0000 mg | ORAL_TABLET | Freq: Every day | ORAL | 0 refills | Status: DC

## 2019-08-27 NOTE — Progress Notes (Signed)
[facility] MD/PA/NP OP Progress Note  08/27/2019 8:35 AM Jason Murray  MRN:  [id number] Interview was conducted by phone and I verified that I was speaking with the correct person using two identifiers. I discussed the limitations of evaluation and management by telemedicine and  the availability of in person appointments. Patient expressed understanding and agreed to proceed.  Chief Complaint: "Everything is going well".  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 Jason Murray various doses ofIRAdderall on a bid schedule and realized that 30 mg daily works well enough for ADD symptoms all day long. Hecannot take even 10 mg at noon as he cannot fall asleep later. We tried30 mg XR daily but Jason Murray reported problems falling asleep on XR form.. Mood neutral, appetite normal.Goes to work (outside job) daily and stays focused all day long.   Visit Diagnosis:    ICD-10-CM   1. 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  Past Medical History:  Past Medical History:  Diagnosis Date  . GERD (gastroesophageal reflux disease)    No past surgical history on file.  Family Psychiatric History: None.  Family History:  Family History  Problem Relation Age of Onset  . Diabetes Mother   . Arthritis Father   . Hearing loss Father   . Hypertension Father   . Prostate cancer Neg Hx   . Colon cancer Neg Hx     Social History:  Social History   Socioeconomic History  . Marital status: Married    Spouse name: Not on file  . Number of children: Not on file  . Years of education: Not on file  . Highest education level: Not on file  Occupational History  . Not on file  Tobacco Use  . Smoking status: Former Smoker    Types: Cigarettes    Quit date: 06/24/2013    Years since quitting: 6.1  . Smokeless tobacco: Never Used  Substance and Sexual Activity  . Alcohol use: Yes  . Drug use: Never  . Sexual activity: Yes   Other Topics Concern  . Not on file  Social History Narrative  . Not on file   Social Determinants of Health   Financial Resource Strain:   . Difficulty of Paying Living Expenses:   Food Insecurity:   . Worried About Charity fundraiser in the Last Year:   . Arboriculturist in the Last Year:   Transportation Needs:   . Film/video editor (Medical):   Marland Kitchen Lack of Transportation (Non-Medical):   Physical Activity:   . Days of Exercise per Week:   . Minutes of Exercise per Session:   Stress:   . Feeling of Stress :   Social Connections:   . Frequency of Communication with Friends and Family:   . Frequency of Social Gatherings with Friends and Family:   . Attends Religious Services:   . Active Member of Clubs or Organizations:   . Attends Archivist Meetings:   Marland Kitchen Marital Status:     Allergies:  Allergies  Allergen Reactions  . Sulfa Antibiotics Swelling    Metabolic Disorder Labs: No results found for: HGBA1C, MPG No results found for: PROLACTIN No results found for: CHOL, TRIG, HDL, CHOLHDL, VLDL, LDLCALC Lab Results  Component Value Date   TSH 1.18 07/10/2018    Therapeutic Level Labs: No results found for: LITHIUM No results found for: VALPROATE No components found for:  CBMZ  Current Medications: Current Outpatient Medications  Medication Sig Dispense Refill  . amphetamine-dextroamphetamine (ADDERALL) 30 MG tablet Take 1 tablet by mouth daily before breakfast. 30 tablet 0  . [START ON 09/27/2019] amphetamine-dextroamphetamine (ADDERALL) 30 MG tablet Take 1 tablet by mouth daily before breakfast. 30 tablet 0  . [START ON 10/27/2019] amphetamine-dextroamphetamine (ADDERALL) 30 MG tablet Take 1 tablet by mouth daily before breakfast. 30 tablet 0  . ibuprofen (ADVIL,MOTRIN) 200 MG tablet Take 200 mg by mouth every 6 (six) hours as needed.     No current facility-administered medications for this visit.    Psychiatric Specialty Exam: Review of  Systems  All other systems reviewed and are negative.   There were no vitals taken for this visit.There is no height or weight on file to calculate BMI.  General Appearance: NA  Eye Contact:  NA  Speech:  Clear and Coherent and Normal Rate  Volume:  Normal  Mood:  Euthymic  Affect:  NA  Thought Process:  Goal Directed and Linear  Orientation:  Full (Time, Place, and Person)  Thought Content: Logical   Suicidal Thoughts:  No  Homicidal Thoughts:  No  Memory:  Immediate;   Good Recent;   Good Remote;   Good  Judgement:  Good  Insight:  Good  Psychomotor Activity:  NA  Concentration:  Concentration: Good  Recall:  Good  Fund of Knowledge: Good  Language: Good  Akathisia:  Negative  Handed:  Right  AIMS (if indicated): not done  Assets:  Communication Skills Desire for Improvement Financial Resources/Insurance Housing Intimacy Physical Health Social Support Talents/Skills  ADL's:  Intact  Cognition: WNL  Sleep:  Good   Screenings: GAD-7     Office Visit from 07/10/2018 in [facility]  Total GAD-7 Score  5    PHQ2-9     Office Visit from 07/10/2018 in [facility] from 06/24/2018 in [facility]  PHQ-2 Total Score  2  0  PHQ-9 Total Score  7  -       Assessment and Plan: 34 yo married male with 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 Jason Murray various doses ofIRAdderall on a bid schedule and realized that 30 mg daily works well enough for ADD symptoms all day long. Hecannot take even 10 mg at noon as he cannot fall asleep later. We tried30 mg XR daily but Jason Murray reported problems falling asleep on XR form. Mood neutral, appetite normal.Goes to work (outside job) daily and stays focused all day long.   Dx: ADD adult  Plan: Continue Adderall 30 mg in AM.Jason Murray will return for next visit in 3 months.The plan was discussed with patient who had an  opportunity to ask questions and these were all answered. I spend15 minutes inphone consultation with the patient.    Magdalene Patricia, MD 08/27/2019, 8:35 AM

## 2019-09-08 ENCOUNTER — Telehealth (HOSPITAL_COMMUNITY): Payer: Self-pay

## 2019-09-08 NOTE — Telephone Encounter (Signed)
Prior authorization was sent for Adderall 30mg  tablet through [employer] ( ). Prior auth ID: [email or web address] Will check status of approval on 3/31.

## 2019-09-15 NOTE — Telephone Encounter (Signed)
09/15/19: Received approval of prior authorization for Adderall 30mg  tab. Approved through 09/09/2020.

## 2019-11-26 ENCOUNTER — Telehealth (INDEPENDENT_AMBULATORY_CARE_PROVIDER_SITE_OTHER): Payer: 59 | Admitting: Psychiatry

## 2019-11-26 ENCOUNTER — Other Ambulatory Visit: Payer: Self-pay

## 2019-11-26 DIAGNOSIS — F909 Attention-deficit hyperactivity disorder, unspecified type: Secondary | ICD-10-CM

## 2019-11-26 MED ORDER — AMPHETAMINE-DEXTROAMPHETAMINE 30 MG PO TABS: 30.0000 mg | ORAL_TABLET | Freq: Every day | ORAL | 0 refills | Status: DC

## 2019-11-26 NOTE — Progress Notes (Signed)
[facility] MD/PA/NP OP Progress Note  11/26/2019 3:06 PM Jason Murray  MRN:  [id number] Interview was conducted by phone and I verified that I was speaking with the correct person using two identifiers. I discussed the limitations of evaluation and management by telemedicine and  the availability of in person appointments. Patient expressed understanding and agreed to proceed. Patient location - work office; physician - home office.  Chief Complaint: None.  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 Steventried various doses ofIRAdderall on a bid schedule and realized that 30 mg daily works well enough for ADD symptoms all day long. Hecannot take even 10 mg at noon as he cannot fall asleep later. We tried30 mg XR daily but Sotirios reported problems falling asleep on XR form. 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   Visit Diagnosis:    ICD-10-CM   1. 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  Past Medical History:  Past Medical History:  Diagnosis Date  . GERD (gastroesophageal reflux disease)    No past surgical history on file.  Family Psychiatric History: None.  Family History:  Family History  Problem Relation Age of Onset  . Diabetes Mother   . Arthritis Father   . Hearing loss Father   . Hypertension Father   . Prostate cancer Neg Hx   . Colon cancer Neg Hx     Social History:  Social History   Socioeconomic History  . Marital status: Married    Spouse name: Not on file  . Number of children: Not on file  . Years of education: Not on file  . Highest education level: Not on file  Occupational History  . Not on file  Tobacco Use  . Smoking status: Former Smoker    Types: Cigarettes    Quit date: 06/24/2013    Years since quitting: 6.4  . Smokeless tobacco: Never Used  Vaping Use  . Vaping Use: Every day   Substance and Sexual Activity  . Alcohol use: Yes  . Drug use: Never  . Sexual activity: Yes  Other Topics Concern  . Not on file  Social History Narrative  . Not on file   Social Determinants of Health   Financial Resource Strain:   . Difficulty of Paying Living Expenses:   Food Insecurity:   . Worried About Charity fundraiser in the Last Year:   . Arboriculturist in the Last Year:   Transportation Needs:   . Film/video editor (Medical):   Marland Kitchen Lack of Transportation (Non-Medical):   Physical Activity:   . Days of Exercise per Week:   . Minutes of Exercise per Session:   Stress:   . Feeling of Stress :   Social Connections:   . Frequency of Communication with Friends and Family:   . Frequency of Social Gatherings with Friends and Family:   . Attends Religious Services:   . Active Member of Clubs or Organizations:   . Attends Archivist Meetings:   Marland Kitchen Marital Status:     Allergies:  Allergies  Allergen Reactions  . Sulfa Antibiotics Swelling    Metabolic Disorder Labs: No results found for: HGBA1C, MPG No results found for: PROLACTIN No results found for: CHOL, TRIG, HDL, CHOLHDL, VLDL, LDLCALC Lab Results  Component Value Date   TSH 1.18 07/10/2018  Therapeutic Level Labs: No results found for: LITHIUM No results found for: VALPROATE No components found for:  CBMZ  Current Medications: Current Outpatient Medications  Medication Sig Dispense Refill  . [START ON 01/27/2020] amphetamine-dextroamphetamine (ADDERALL) 30 MG tablet Take 1 tablet by mouth daily before breakfast. 30 tablet 0  . [START ON 12/27/2019] amphetamine-dextroamphetamine (ADDERALL) 30 MG tablet Take 1 tablet by mouth daily before breakfast. 30 tablet 0  . [START ON 11/27/2019] amphetamine-dextroamphetamine (ADDERALL) 30 MG tablet Take 1 tablet by mouth daily before breakfast. 30 tablet 0  . ibuprofen (ADVIL,MOTRIN) 200 MG tablet Take 200 mg by mouth every 6 (six) hours as  needed.     No current facility-administered medications for this visit.      Psychiatric Specialty Exam: Review of Systems  All other systems reviewed and are negative.   There were no vitals taken for this visit.There is no height or weight on file to calculate BMI.  General Appearance: NA  Eye Contact:  NA  Speech:  Clear and Coherent and Normal Rate  Volume:  Normal  Mood:  Euthymic  Affect:  NA  Thought Process:  Goal Directed and Linear  Orientation:  Full (Time, Place, and Person)  Thought Content: Logical   Suicidal Thoughts:  No  Homicidal Thoughts:  No  Memory:  Immediate;   Good Recent;   Good Remote;   Good  Judgement:  Good  Insight:  Good  Psychomotor Activity:  NA  Concentration:  Concentration: Good  Recall:  Good  Fund of Knowledge: Good  Language: Good  Akathisia:  Negative  Handed:  Right  AIMS (if indicated): not done  Assets:  Communication Skills Desire for Improvement Financial Resources/Insurance Housing Intimacy Physical Health Talents/Skills Vocational/Educational  ADL's:  Intact  Cognition: WNL  Sleep:  Good   Screenings: GAD-7     Office Visit from 07/10/2018 in [facility]  Total GAD-7 Score 5    PHQ2-9     Office Visit from 07/10/2018 in [facility] from 06/24/2018 in [facility]  PHQ-2 Total Score 2 0  PHQ-9 Total Score 7 --       Assessment and Plan: 33yo married male with 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 Steventried various doses ofIRAdderall on a bid schedule and realized that 30 mg daily works well enough for ADD symptoms all day long. Hecannot take even 10 mg at noon as he cannot fall asleep later. We tried30 mg XR daily but Telford reported problems falling asleep on XR form. 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  Dx: ADD  adult  Plan: Continue Adderall 30 mg in AM.Mate will return for next visit in 3 months.The plan was discussed with patient who had an opportunity to ask questions and these were all answered. I spend10 minutes inphone consultation with the patient.   Magdalene Patricia, MD 11/26/2019, 3:06 PM

## 2019-12-08 ENCOUNTER — Ambulatory Visit (INDEPENDENT_AMBULATORY_CARE_PROVIDER_SITE_OTHER): Payer: 59 | Admitting: Family Medicine

## 2019-12-08 ENCOUNTER — Other Ambulatory Visit: Payer: Self-pay

## 2019-12-08 ENCOUNTER — Encounter: Payer: Self-pay | Admitting: Family Medicine

## 2019-12-08 VITALS — BP 158/82 | HR 92 | Temp 98.6°F | Ht 76.0 in | Wt 205.8 lb

## 2019-12-08 DIAGNOSIS — H6091 Unspecified otitis externa, right ear: Secondary | ICD-10-CM

## 2019-12-08 MED ORDER — AMOXICILLIN 875 MG PO TABS: 875.0000 mg | ORAL_TABLET | Freq: Two times a day (BID) | ORAL | 0 refills | Status: DC

## 2019-12-08 MED ORDER — ACETIC ACID 2 % OT SOLN: 4.0000 [drp] | OTIC | 0 refills | Status: DC

## 2019-12-08 NOTE — Progress Notes (Signed)
   Jason Murray is a 33 y.o. male who presents today for an office visit.  Assessment/Plan:  New/Acute Problems: Otitis Externa Start acetic acid drops.  Doubt perforation getting normal hearing.  Also send in pocket prescription for amoxicillin in case symptoms do not improve in the next 2 days.  Discussed reasons return to care.  Follow-up as needed.    Subjective:  HPI:  Patient with right ear pain for the past couple of days.  Feels like last time he ruptured his eardrum several years ago.  Feels like water in his ear.  No fevers.  No hearing loss.  No obvious precipitating events.       Objective:  Physical Exam: BP (!) 158/82 (BP Location: Left Arm, Patient Position: Sitting, Cuff Size: Normal)   Pulse 92   Temp 98.6 F (37 C) (Temporal)   Ht 6\' 4"  (1.93 m)   Wt 205 lb 12.8 oz (93.4 kg)   SpO2 97%   BMI 25.05 kg/m   Gen: No acute distress, resting comfortably HEENT: Left TM clear.  Left EAC with purulent debris.  Grossly normal and equal hearing bilaterally.      . [name], MD 12/08/2019 1:07 PM

## 2019-12-08 NOTE — Patient Instructions (Signed)
It was very nice to see you today!  Please start the eardrops.  If not improving the next couple days please start amoxicillin.  Let me know if not proving.  Please come back soon for your annual physical.  Take care, Dr Jimmey Ralph  Please try these tips to maintain a healthy lifestyle:   Eat at least 3 REAL meals and 1-2 snacks per day.  Aim for no more than 5 hours between eating.  If you eat breakfast, please do so within one hour of getting up.    Each meal should contain half fruits/vegetables, one quarter protein, and one quarter carbs (no bigger than a computer mouse)   Cut down on sweet beverages. This includes juice, soda, and sweet tea.     Drink at least 1 glass of water with each meal and aim for at least 8 glasses per day   Exercise at least 150 minutes every week.

## 2020-02-11 ENCOUNTER — Telehealth: Payer: Self-pay | Admitting: Family Medicine

## 2020-02-11 NOTE — Telephone Encounter (Signed)
Patient is following up regarding bill for inccorect amount for appt 6.29.2021

## 2020-02-25 ENCOUNTER — Telehealth (INDEPENDENT_AMBULATORY_CARE_PROVIDER_SITE_OTHER): Payer: 59 | Admitting: Psychiatry

## 2020-02-25 ENCOUNTER — Other Ambulatory Visit: Payer: Self-pay

## 2020-02-25 DIAGNOSIS — F909 Attention-deficit hyperactivity disorder, unspecified type: Secondary | ICD-10-CM

## 2020-02-25 MED ORDER — AMPHETAMINE-DEXTROAMPHETAMINE 30 MG PO TABS: 30.0000 mg | ORAL_TABLET | Freq: Every day | ORAL | 0 refills | Status: DC

## 2020-02-25 NOTE — Progress Notes (Signed)
[facility] MD/PA/NP OP Progress Note  02/25/2020 3:12 PM Jason Murray  MRN:  [id number] Interview was conducted by phone and I verified that I was speaking with the correct person using two identifiers. I discussed the limitations of evaluation and management by telemedicine and  the availability of in person appointments. Patient expressed understanding and agreed to proceed. Patient location - home; physician - home office.  Chief Complaint: None.  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 Steventried various doses ofIRAdderall on a bid schedule and realized that30mg  daily works well enough for ADD symptoms all day long. Hecannot take even 10 mg at noon as he cannot fall asleep later. 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   Visit Diagnosis:    ICD-10-CM   1. 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  Past Medical History:  Past Medical History:  Diagnosis Date  . GERD (gastroesophageal reflux disease)    No past surgical history on file.  Family Psychiatric History: None.  Family History:  Family History  Problem Relation Age of Onset  . Diabetes Mother   . Arthritis Father   . Hearing loss Father   . Hypertension Father   . Prostate cancer Neg Hx   . Colon cancer Neg Hx     Social History:  Social History   Socioeconomic History  . Marital status: Married    Spouse name: Not on file  . Number of children: Not on file  . Years of education: Not on file  . Highest education level: Not on file  Occupational History  . Not on file  Tobacco Use  . Smoking status: Former Smoker    Types: Cigarettes    Quit date: 06/24/2013    Years since quitting: 6.6  . Smokeless tobacco: Never Used   Vaping Use  . Vaping Use: Every day  Substance and Sexual Activity  . Alcohol use: Yes  . Drug use: Never  . Sexual activity: Yes  Other Topics Concern  . Not on file  Social History Narrative  . Not on file   Social Determinants of Health   Financial Resource Strain:   . Difficulty of Paying Living Expenses: Not on file  Food Insecurity:   . Worried About Programme researcher, broadcasting/film/video in the Last Year: Not on file  . Ran Out of Food in the Last Year: Not on file  Transportation Needs:   . Lack of Transportation (Medical): Not on file  . Lack of Transportation (Non-Medical): Not on file  Physical Activity:   . Days of Exercise per Week: Not on file  . Minutes of Exercise per Session: Not on file  Stress:   . Feeling of Stress : Not on file  Social Connections:   . Frequency of Communication with Friends and Family: Not on file  . Frequency of Social Gatherings with Friends and Family: Not on file  . Attends Religious Services: Not on file  . Active Member of Clubs or Organizations: Not on file  . Attends Banker Meetings: Not on file  . Marital Status: Not on file    Allergies:  Allergies  Allergen Reactions  . Sulfa Antibiotics Swelling    Metabolic Disorder Labs: No results  found for: HGBA1C, MPG No results found for: PROLACTIN No results found for: CHOL, TRIG, HDL, CHOLHDL, VLDL, LDLCALC Lab Results  Component Value Date   TSH 1.18 07/10/2018    Therapeutic Level Labs: No results found for: LITHIUM No results found for: VALPROATE No components found for:  CBMZ  Current Medications: Current Outpatient Medications  Medication Sig Dispense Refill  . acetic acid 2 % otic solution Place 4 drops into the right ear every 3 (three) hours. 15 mL 0  . amphetamine-dextroamphetamine (ADDERALL) 30 MG tablet Take 1 tablet by mouth daily before breakfast. 30 tablet 0  . [START ON 02/26/2020] amphetamine-dextroamphetamine (ADDERALL) 30 MG tablet Take 1 tablet by  mouth daily before breakfast. 30 tablet 0  . [START ON 03/27/2020] amphetamine-dextroamphetamine (ADDERALL) 30 MG tablet Take 1 tablet by mouth daily before breakfast. 30 tablet 0  . [START ON 04/26/2020] amphetamine-dextroamphetamine (ADDERALL) 30 MG tablet Take 1 tablet by mouth daily before breakfast. 30 tablet 0  . ibuprofen (ADVIL,MOTRIN) 200 MG tablet Take 200 mg by mouth every 6 (six) hours as needed.     No current facility-administered medications for this visit.    Psychiatric Specialty Exam: Review of Systems  All other systems reviewed and are negative.   There were no vitals taken for this visit.There is no height or weight on file to calculate BMI.  General Appearance: NA  Eye Contact:  NA  Speech:  Clear and Coherent and Normal Rate  Volume:  Normal  Mood:  Euthymic  Affect:  NA  Thought Process:  Goal Directed and Linear  Orientation:  Full (Time, Place, and Person)  Thought Content: Logical   Suicidal Thoughts:  No  Homicidal Thoughts:  No  Memory:  Immediate;   Good Recent;   Good Remote;   Good  Judgement:  Good  Insight:  Good  Psychomotor Activity:  NA  Concentration:  Concentration: Good  Recall:  Good  Fund of Knowledge: Good  Language: Good  Akathisia:  Negative  Handed:  Right  AIMS (if indicated): not done  Assets:  Communication Skills Desire for Improvement Financial Resources/Insurance Housing Intimacy Physical Health Social Support Talents/Skills  ADL's:  Intact  Cognition: WNL  Sleep:  Good   Screenings: GAD-7     Office Visit from 07/10/2018 in [facility]  Total GAD-7 Score 5    PHQ2-9     Office Visit from 07/10/2018 in [facility] from 06/24/2018 in [facility]  PHQ-2 Total Score 2 0  PHQ-9 Total Score 7 -       Assessment and Plan:  33yo married male with 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 Steventried various  doses ofIRAdderall on a bid schedule and realized that30mg  daily works well enough for ADD symptoms all day long. Hecannot take even 10 mg at noon as he cannot fall asleep later. 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  Dx: ADHD adult  Plan: Continue Adderall 30 mg in AM.Mahlik will return for next visit in 3 months.The plan was discussed with patient who had an opportunity to ask questions and these were all answered. I spend10 minutes inphone consultation with the patient.   Magdalene Patricia, MD 02/25/2020, 3:12 PM

## 2020-03-18 NOTE — Telephone Encounter (Signed)
Looks like patient paid $60.00 payment which looks to be a specialist copay.  I have sent an email to billing to research and to follow back up with patient in regard.  Patient did state provider is listed as PCP with insurance.

## 2020-05-30 ENCOUNTER — Other Ambulatory Visit: Payer: Self-pay

## 2020-05-30 ENCOUNTER — Ambulatory Visit (INDEPENDENT_AMBULATORY_CARE_PROVIDER_SITE_OTHER): Payer: 59 | Admitting: Family Medicine

## 2020-05-30 ENCOUNTER — Encounter: Payer: Self-pay | Admitting: Family Medicine

## 2020-05-30 VITALS — BP 148/82 | HR 109 | Temp 98.7°F | Ht 76.0 in | Wt 227.0 lb

## 2020-05-30 DIAGNOSIS — Z6827 Body mass index (BMI) 27.0-27.9, adult: Secondary | ICD-10-CM | POA: Diagnosis not present

## 2020-05-30 DIAGNOSIS — R030 Elevated blood-pressure reading, without diagnosis of hypertension: Secondary | ICD-10-CM | POA: Insufficient documentation

## 2020-05-30 DIAGNOSIS — Z0001 Encounter for general adult medical examination with abnormal findings: Secondary | ICD-10-CM

## 2020-05-30 DIAGNOSIS — E663 Overweight: Secondary | ICD-10-CM | POA: Diagnosis not present

## 2020-05-30 NOTE — Assessment & Plan Note (Signed)
Elevated today though has previously been at goal.  Continue home monitoring goal 140/90 or lower.  Discussed lifestyle modifications including low-sodium diet.

## 2020-05-30 NOTE — Patient Instructions (Signed)
It was very nice to see you today!  Please work on exercises for your shoulder.  Please continue working on diet and exercise.  I will see you back in a year for your next physical.  Please come back to see me sooner if needed.  Take care, Dr Jerline Jason Murray  Please try these tips to maintain a healthy lifestyle:   Eat at least 3 REAL meals and 1-2 snacks per day.  Aim for no more than 5 hours between eating.  If you eat breakfast, please do so within one hour of getting up.    Each meal should contain half fruits/vegetables, one quarter protein, and one quarter carbs (no bigger than a computer mouse)   Cut down on sweet beverages. This includes juice, soda, and sweet tea.     Drink at least 1 glass of water with each meal and aim for at least 8 glasses per day   Exercise at least 150 minutes every week.    Preventive Care 21-90 Years Old, Male Preventive care refers to lifestyle choices and visits with your health care provider that can promote health and wellness. This includes:  A yearly physical exam. This is also called an annual well check.  Regular dental and eye exams.  Immunizations.  Screening for certain conditions.  Healthy lifestyle choices, such as eating a healthy diet, getting regular exercise, not using drugs or products that contain nicotine and tobacco, and limiting alcohol use. What can I expect for my preventive care visit? Physical exam Your health care provider will check:  Height and weight. These may be used to calculate body mass index (BMI), which is a measurement that tells if you are at a healthy weight.  Heart rate and blood pressure.  Your skin for abnormal spots. Counseling Your health care provider may ask you questions about:  Alcohol, tobacco, and drug use.  Emotional well-being.  Home and relationship well-being.  Sexual activity.  Eating habits.  Work and work Statistician. What immunizations do I need?  Influenza (flu)  vaccine  This is recommended every year. Tetanus, diphtheria, and pertussis (Tdap) vaccine  You may need a Td booster every 10 years. Varicella (chickenpox) vaccine  You may need this vaccine if you have not already been vaccinated. Human papillomavirus (HPV) vaccine  If recommended by your health care provider, you may need three doses over 6 months. Measles, mumps, and rubella (MMR) vaccine  You may need at least one dose of MMR. You may also need a second dose. Meningococcal conjugate (MenACWY) vaccine  One dose is recommended if you are 5-34 years old and a Market researcher living in a residence hall, or if you have one of several medical conditions. You may also need additional booster doses. Pneumococcal conjugate (PCV13) vaccine  You may need this if you have certain conditions and were not previously vaccinated. Pneumococcal polysaccharide (PPSV23) vaccine  You may need one or two doses if you smoke cigarettes or if you have certain conditions. Hepatitis A vaccine  You may need this if you have certain conditions or if you travel or work in places where you may be exposed to hepatitis A. Hepatitis B vaccine  You may need this if you have certain conditions or if you travel or work in places where you may be exposed to hepatitis B. Haemophilus influenzae type b (Hib) vaccine  You may need this if you have certain risk factors. You may receive vaccines as individual doses or as more than  one vaccine together in one shot (combination vaccines). Talk with your health care provider about the risks and benefits of combination vaccines. What tests do I need? Blood tests  Lipid and cholesterol levels. These may be checked every 5 years starting at age 90.  Hepatitis C test.  Hepatitis B test. Screening   Diabetes screening. This is done by checking your blood sugar (glucose) after you have not eaten for a while (fasting).  Sexually transmitted disease (STD)  testing. Talk with your health care provider about your test results, treatment options, and if necessary, the need for more tests. Follow these instructions at home: Eating and drinking   Eat a diet that includes fresh fruits and vegetables, whole grains, lean protein, and low-fat dairy products.  Take vitamin and mineral supplements as recommended by your health care provider.  Do not drink alcohol if your health care provider tells you not to drink.  If you drink alcohol: ? Limit how much you have to 0-2 drinks a day. ? Be aware of how much alcohol is in your drink. In the [place] one drink equals one 12 oz bottle of beer (355 mL), one 5 oz glass of wine (148 mL), or one 1 oz glass of hard liquor (44 mL). Lifestyle  Take daily care of your teeth and gums.  Stay active. Exercise for at least 30 minutes on 5 or more days each week.  Do not use any products that contain nicotine or tobacco, such as cigarettes, e-cigarettes, and chewing tobacco. If you need help quitting, ask your health care provider.  If you are sexually active, practice safe sex. Use a condom or other form of protection to prevent STIs (sexually transmitted infections). What's next?  Go to your health care provider once a year for a well check visit.  Ask your health care provider how often you should have your eyes and teeth checked.  Stay up to date on all vaccines. This information is not intended to replace advice given to you by your health care provider. Make sure you discuss any questions you have with your health care provider. Document Revised: 05/22/2018 Document Reviewed: 05/22/2018 Elsevier Patient Education  2020 [employer].

## 2020-05-30 NOTE — Progress Notes (Signed)
Chief Complaint:  Jason Murray is a 34 y.o. male who presents today for his annual comprehensive physical exam.    Assessment/Plan:  New/Acute Problems: Left Shoulder Pain Consistent with rotator cuff tendinopathy with possible impingement.  Discussed home exercises and handout was given  Chronic Problems Addressed Today: Elevated blood pressure reading Elevated today though has previously been at goal.  Continue home monitoring goal 140/90 or lower.  Discussed lifestyle modifications including low-sodium diet.   Body mass index is 27.63 kg/m. / Overweight  BMI Metric Follow Up - 05/30/20 1441      BMI Metric Follow Up-Please document annually   BMI Metric Follow Up Education provided            Preventative Healthcare: UTD on vaccines and screenings. Flu vaccine declined.   Patient Counseling(The following topics were reviewed and/or handout was given):  -Nutrition: Stressed importance of moderation in sodium/caffeine intake, saturated fat and cholesterol, caloric balance, sufficient intake of fresh fruits, vegetables, and fiber.  -Stressed the importance of regular exercise.   -Substance Abuse: Discussed cessation/primary prevention of tobacco, alcohol, or other drug use; driving or other dangerous activities under the influence; availability of treatment for abuse.   -Injury prevention: Discussed safety belts, safety helmets, smoke detector, smoking near bedding or upholstery.   -Sexuality: Discussed sexually transmitted diseases, partner selection, use of condoms, avoidance of unintended pregnancy and contraceptive alternatives.   -Dental health: Discussed importance of regular tooth brushing, flossing, and dental visits.  -Health maintenance and immunizations reviewed. Please refer to Health maintenance section.  Return to care in 1 year for next preventative visit.     Subjective:  HPI:  He has no acute complaints today.   Patient with intermittent left shoulder  pain.  Hurts with certain motions.  Feels like a popping sensation in the posterior shoulder.  No obvious injuries or precipitating events.  Lifestyle Diet: Balanced. Plenty of fruits and vegetables.  Exercise: Physical job   Depression screen [facility] 2/9 05/30/2020  Decreased Interest 0  Down, Depressed, Hopeless 0  PHQ - 2 Score 0  Altered sleeping -  Tired, decreased energy -  Change in appetite -  Feeling bad or failure about yourself  -  Trouble concentrating -  Moving slowly or fidgety/restless -  Suicidal thoughts -  PHQ-9 Score -    Health Maintenance Due  Topic Date Due  . Hepatitis C Screening  Never done  . HIV Screening  Never done     ROS: Per HPI, otherwise a complete review of systems was negative.   PMH:  The following were reviewed and entered/updated in epic: Past Medical History:  Diagnosis Date  . GERD (gastroesophageal reflux disease)    Patient Active Problem List   Diagnosis Date Noted  . Elevated blood pressure reading 05/30/2020  . Adult ADHD 07/24/2018   History reviewed. No pertinent surgical history.  Family History  Problem Relation Age of Onset  . Diabetes Mother   . Arthritis Father   . Hearing loss Father   . Hypertension Father   . Prostate cancer Neg Hx   . Colon cancer Neg Hx     Medications- reviewed and updated Current Outpatient Medications  Medication Sig Dispense Refill  . amphetamine-dextroamphetamine (ADDERALL) 30 MG tablet Take 1 tablet by mouth daily before breakfast. 30 tablet 0  . amphetamine-dextroamphetamine (ADDERALL) 30 MG tablet Take 1 tablet by mouth daily before breakfast. 30 tablet 0  . amphetamine-dextroamphetamine (ADDERALL) 30 MG tablet Take 1 tablet by  mouth daily before breakfast. 30 tablet 0  . amphetamine-dextroamphetamine (ADDERALL) 30 MG tablet Take 1 tablet by mouth daily before breakfast. 30 tablet 0  . ibuprofen (ADVIL,MOTRIN) 200 MG tablet Take 200 mg by mouth every 6 (six) hours as needed.      No current facility-administered medications for this visit.    Allergies-reviewed and updated Allergies  Allergen Reactions  . Sulfa Antibiotics Swelling    Social History   Socioeconomic History  . Marital status: Married    Spouse name: Not on file  . Number of children: Not on file  . Years of education: Not on file  . Highest education level: Not on file  Occupational History  . Not on file  Tobacco Use  . Smoking status: Former Smoker    Types: Cigarettes    Quit date: 06/24/2013    Years since quitting: 6.9  . Smokeless tobacco: Never Used  Vaping Use  . Vaping Use: Every day  Substance and Sexual Activity  . Alcohol use: Yes  . Drug use: Never  . Sexual activity: Yes  Other Topics Concern  . Not on file  Social History Narrative  . Not on file   Social Determinants of Health   Financial Resource Strain: Not on file  Food Insecurity: Not on file  Transportation Needs: Not on file  Physical Activity: Not on file  Stress: Not on file  Social Connections: Not on file        Objective:  Physical Exam: BP (!) 148/82   Pulse (!) 109   Temp 98.7 F (37.1 C) (Temporal)   Ht 6\' 4"  (1.93 m)   Wt 227 lb (103 kg)   SpO2 97%   BMI 27.63 kg/m   Body mass index is 27.63 kg/m. Wt Readings from Last 3 Encounters:  05/30/20 227 lb (103 kg)  12/08/19 205 lb 12.8 oz (93.4 kg)  07/10/18 208 lb 4 oz (94.5 kg)   Gen: NAD, resting comfortably HEENT: TMs normal bilaterally. OP clear. No thyromegaly noted.  CV: RRR with no murmurs appreciated Pulm: NWOB, CTAB with no crackles, wheezes, or rhonchi GI: Normal bowel sounds present. Soft, Nontender, Nondistended. MSK: no edema, cyanosis, or clubbing noted.  Left shoulder without deformities.  No pain with supraspinatus testing.  Internal and external rotation.  Neer and Hawkins sign positive. Skin: warm, dry Neuro: CN2-12 grossly intact. Strength 5/5 in upper and lower extremities. Reflexes symmetric and intact  bilaterally.  Psych: Normal affect and thought content     Kimbrely Buckel M. 07/12/18, MD 05/30/2020 2:49 PM

## 2020-05-31 ENCOUNTER — Other Ambulatory Visit (HOSPITAL_COMMUNITY): Payer: Self-pay | Admitting: Psychiatry

## 2020-05-31 ENCOUNTER — Telehealth (HOSPITAL_COMMUNITY): Payer: Self-pay | Admitting: *Deleted

## 2020-05-31 MED ORDER — AMPHETAMINE-DEXTROAMPHETAMINE 30 MG PO TABS: 30.0000 mg | ORAL_TABLET | Freq: Every day | ORAL | 0 refills | Status: DC

## 2020-05-31 NOTE — Telephone Encounter (Signed)
Done

## 2020-05-31 NOTE — Telephone Encounter (Signed)
Pt called requesting a refill of the Adderall 30 mg. Pt has an upcoming appointment on 06/07/20.

## 2020-06-01 ENCOUNTER — Encounter (HOSPITAL_COMMUNITY)
Admission: EM | Disposition: A | Discharge status: Home / Self Care | Payer: Self-pay | Source: Home / Self Care | Attending: Emergency Medicine

## 2020-06-01 ENCOUNTER — Inpatient Hospital Stay (HOSPITAL_COMMUNITY): Payer: 59 | Admitting: Certified Registered Nurse Anesthetist

## 2020-06-01 ENCOUNTER — Emergency Department (HOSPITAL_COMMUNITY): Payer: 59

## 2020-06-01 ENCOUNTER — Encounter (HOSPITAL_COMMUNITY): Payer: Self-pay | Admitting: Emergency Medicine

## 2020-06-01 ENCOUNTER — Observation Stay (HOSPITAL_COMMUNITY)
Admission: EM | Admit: 2020-06-01 | Discharge: 2020-06-01 | Disposition: A | Discharge status: Home / Self Care | Payer: 59 | Attending: Surgery | Admitting: Surgery

## 2020-06-01 DIAGNOSIS — X58XXXA Exposure to other specified factors, initial encounter: Secondary | ICD-10-CM | POA: Diagnosis not present

## 2020-06-01 DIAGNOSIS — K625 Hemorrhage of anus and rectum: Secondary | ICD-10-CM | POA: Diagnosis present

## 2020-06-01 DIAGNOSIS — Z20822 Contact with and (suspected) exposure to covid-19: Secondary | ICD-10-CM | POA: Insufficient documentation

## 2020-06-01 DIAGNOSIS — K219 Gastro-esophageal reflux disease without esophagitis: Secondary | ICD-10-CM | POA: Diagnosis present

## 2020-06-01 DIAGNOSIS — T185XXA Foreign body in anus and rectum, initial encounter: Principal | ICD-10-CM | POA: Diagnosis present

## 2020-06-01 DIAGNOSIS — Z87891 Personal history of nicotine dependence: Secondary | ICD-10-CM | POA: Insufficient documentation

## 2020-06-01 HISTORY — PX: RECTAL EXAM UNDER ANESTHESIA: SHX6399

## 2020-06-01 LAB — CBC WITH DIFFERENTIAL/PLATELET
Abs Immature Granulocytes: 0.03 10*3/uL (ref 0.00–0.07)
Basophils Absolute: 0.1 10*3/uL (ref 0.0–0.1)
Basophils Relative: 1 %
Eosinophils Absolute: 0.3 10*3/uL (ref 0.0–0.5)
Eosinophils Relative: 4 %
HCT: 43.3 % (ref 39.0–52.0)
Hemoglobin: 15.1 g/dL (ref 13.0–17.0)
Immature Granulocytes: 0 %
Lymphocytes Relative: 14 %
Lymphs Abs: 1.2 10*3/uL (ref 0.7–4.0)
MCH: 30.3 pg (ref 26.0–34.0)
MCHC: 34.9 g/dL (ref 30.0–36.0)
MCV: 86.9 fL (ref 80.0–100.0)
Monocytes Absolute: 0.5 10*3/uL (ref 0.1–1.0)
Monocytes Relative: 6 %
Neutro Abs: 6.4 10*3/uL (ref 1.7–7.7)
Neutrophils Relative %: 75 %
Platelets: 222 10*3/uL (ref 150–400)
RBC: 4.98 MIL/uL (ref 4.22–5.81)
RDW: 12.4 % (ref 11.5–15.5)
WBC: 8.5 10*3/uL (ref 4.0–10.5)
nRBC: 0 % (ref 0.0–0.2)

## 2020-06-01 LAB — SURGICAL PCR SCREEN
MRSA, PCR: NEGATIVE
Staphylococcus aureus: POSITIVE — AB

## 2020-06-01 LAB — BASIC METABOLIC PANEL
Anion gap: 12 (ref 5–15)
BUN: 12 mg/dL (ref 6–20)
CO2: 22 mmol/L (ref 22–32)
Calcium: 9.3 mg/dL (ref 8.9–10.3)
Chloride: 101 mmol/L (ref 98–111)
Creatinine, Ser: 0.99 mg/dL (ref 0.61–1.24)
GFR, Estimated: >60 mL/min (ref >60)
Glucose, Bld: 104 mg/dL — ABNORMAL HIGH (ref 70–99)
Potassium: 4 mmol/L (ref 3.5–5.1)
Sodium: 135 mmol/L (ref 135–145)

## 2020-06-01 LAB — RESP PANEL BY RT-PCR (FLU A&B, COVID) ARPGX2
Influenza A by PCR: NEGATIVE
Influenza B by PCR: NEGATIVE
SARS Coronavirus 2 by RT PCR: NEGATIVE

## 2020-06-01 SURGERY — EXAM UNDER ANESTHESIA, RECTUM
Anesthesia: General

## 2020-06-01 MED ORDER — PROPOFOL 10 MG/ML IV BOLUS
INTRAVENOUS | Status: AC
  Filled 2020-06-01: qty 40

## 2020-06-01 MED ORDER — ONDANSETRON HCL 4 MG/2ML IJ SOLN: 4.0000 mg | Freq: Three times a day (TID) | INTRAMUSCULAR | Status: AC | PRN

## 2020-06-01 MED ORDER — DEXAMETHASONE SODIUM PHOSPHATE 10 MG/ML IJ SOLN
INTRAMUSCULAR | Status: DC | PRN
  Administered 2020-06-01: 10 mg via INTRAVENOUS

## 2020-06-01 MED ORDER — SODIUM CHLORIDE 0.9 % IV SOLN
INTRAVENOUS | Status: AC
Start: 2020-06-01 — End: 2020-06-01

## 2020-06-01 MED ORDER — FENTANYL CITRATE (PF) 100 MCG/2ML IJ SOLN
INTRAMUSCULAR | Status: AC
  Filled 2020-06-01: qty 2

## 2020-06-01 MED ORDER — MIDAZOLAM HCL 2 MG/2ML IJ SOLN
INTRAMUSCULAR | Status: AC
  Filled 2020-06-01: qty 2

## 2020-06-01 MED ORDER — ONDANSETRON HCL 4 MG/2ML IJ SOLN
INTRAMUSCULAR | Status: DC | PRN
  Administered 2020-06-01: 4 mg via INTRAVENOUS

## 2020-06-01 MED ORDER — MIDAZOLAM HCL 5 MG/5ML IJ SOLN
INTRAMUSCULAR | Status: DC | PRN
  Administered 2020-06-01: 2 mg via INTRAVENOUS

## 2020-06-01 MED ORDER — CHLORHEXIDINE GLUCONATE CLOTH 2 % EX PADS: 6.0000 | MEDICATED_PAD | Freq: Once | CUTANEOUS | Status: DC

## 2020-06-01 MED ORDER — HYDROMORPHONE HCL 1 MG/ML IJ SOLN
0.5000 mg | INTRAMUSCULAR | Status: AC | PRN
  Administered 2020-06-01 (×2): 0.5 mg via INTRAVENOUS
  Filled 2020-06-01 (×2): qty 0.5
  Filled 2020-06-01: qty 1

## 2020-06-01 MED ORDER — LACTATED RINGERS IV SOLN: INTRAVENOUS | Status: DC | PRN

## 2020-06-01 MED ORDER — LIDOCAINE 2% (20 MG/ML) 5 ML SYRINGE
INTRAMUSCULAR | Status: DC | PRN
  Administered 2020-06-01: 80 mg via INTRAVENOUS

## 2020-06-01 MED ORDER — PROPOFOL 10 MG/ML IV BOLUS
INTRAVENOUS | Status: DC | PRN
  Administered 2020-06-01: 200 mg via INTRAVENOUS

## 2020-06-01 MED ORDER — ROCURONIUM BROMIDE 10 MG/ML (PF) SYRINGE
PREFILLED_SYRINGE | INTRAVENOUS | Status: AC
  Filled 2020-06-01: qty 10

## 2020-06-01 MED ORDER — SUCCINYLCHOLINE CHLORIDE 200 MG/10ML IV SOSY
PREFILLED_SYRINGE | INTRAVENOUS | Status: DC | PRN
  Administered 2020-06-01: 140 mg via INTRAVENOUS

## 2020-06-01 MED ORDER — DEXAMETHASONE SODIUM PHOSPHATE 10 MG/ML IJ SOLN
INTRAMUSCULAR | Status: AC
  Filled 2020-06-01: qty 1

## 2020-06-01 MED ORDER — 0.9 % SODIUM CHLORIDE (POUR BTL) OPTIME
TOPICAL | Status: DC | PRN
  Administered 2020-06-01: 14:00:00 1000 mL

## 2020-06-01 MED ORDER — ONDANSETRON HCL 4 MG/2ML IJ SOLN
INTRAMUSCULAR | Status: AC
  Filled 2020-06-01: qty 2

## 2020-06-01 MED ORDER — CHLORHEXIDINE GLUCONATE CLOTH 2 % EX PADS
6.0000 | MEDICATED_PAD | Freq: Once | CUTANEOUS | Status: AC
  Administered 2020-06-01: 10:00:00 6 via TOPICAL

## 2020-06-01 MED ORDER — FENTANYL CITRATE (PF) 100 MCG/2ML IJ SOLN
INTRAMUSCULAR | Status: DC | PRN
  Administered 2020-06-01: 100 ug via INTRAVENOUS

## 2020-06-01 SURGICAL SUPPLY — 22 items
BLADE SURG 15 STRL LF DISP TIS (BLADE) IMPLANT
BLADE SURG 15 STRL SS (BLADE)
CHLORAPREP W/TINT 26 (MISCELLANEOUS) ×2 IMPLANT
COVER SURGICAL LIGHT HANDLE (MISCELLANEOUS) ×2 IMPLANT
COVER WAND RF STERILE (DRAPES) IMPLANT
ELECT REM PT RETURN 15FT ADLT (MISCELLANEOUS) ×2 IMPLANT
GAUZE 4X4 16PLY RFD (DISPOSABLE) ×2 IMPLANT
GLOVE BIOGEL PI IND STRL 7.0 (GLOVE) ×1 IMPLANT
GLOVE BIOGEL PI INDICATOR 7.0 (GLOVE) ×1
GLOVE SURG ORTHO 8.0 STRL STRW (GLOVE) ×2 IMPLANT
GOWN STRL REUS W/TWL LRG LVL3 (GOWN DISPOSABLE) ×2 IMPLANT
GOWN STRL REUS W/TWL XL LVL3 (GOWN DISPOSABLE) ×4 IMPLANT
KIT BASIN OR (CUSTOM PROCEDURE TRAY) ×2 IMPLANT
KIT TURNOVER KIT A (KITS) IMPLANT
NEEDLE HYPO 22GX1.5 SAFETY (NEEDLE) IMPLANT
PACK LITHOTOMY IV (CUSTOM PROCEDURE TRAY) ×2 IMPLANT
PENCIL SMOKE EVACUATOR (MISCELLANEOUS) IMPLANT
SURGILUBE 2OZ TUBE FLIPTOP (MISCELLANEOUS) ×2 IMPLANT
SUT CHROMIC 2 0 SH (SUTURE) IMPLANT
SUT CHROMIC 3 0 SH 27 (SUTURE) IMPLANT
SYR CONTROL 10ML LL (SYRINGE) ×2 IMPLANT
TOWEL OR 17X26 10 PK STRL BLUE (TOWEL DISPOSABLE) ×2 IMPLANT

## 2020-06-01 NOTE — ED Provider Notes (Signed)
[facility]-EMERGENCY DEPT Provider Note   CSN: [id number] Arrival date & time: 06/01/20  0019     History Chief Complaint  Patient presents with  . Foreign Body in Rectum    Jason Murray is a 34 y.o. male.  Patient presents to the emergency department with a chief complaint of rectal foreign body.  He states that he was using it with his wife.  It was inserted voluntarily.  He states that it is approximately 5 to 6 inches and has a suction cup on the base.  He states that his wife attempted to remove it, but was unable and he had some rectal bleeding, which she thinks he got cut by his wife fingernail.  Patient states that he tried to remove it himself, but cannot reach it.  He reports mild to moderate discomfort.  He denies any other associated symptoms.  It was inserted at approximately 9:30 PM (4 hours ago).  The history is provided by the patient. No language interpreter was used.       Past Medical History:  Diagnosis Date  . GERD (gastroesophageal reflux disease)     Patient Active Problem List   Diagnosis Date Noted  . Elevated blood pressure reading 05/30/2020  . Adult ADHD 07/24/2018    History reviewed. No pertinent surgical history.     Family History  Problem Relation Age of Onset  . Diabetes Mother   . Arthritis Father   . Hearing loss Father   . Hypertension Father   . Prostate cancer Neg Hx   . Colon cancer Neg Hx     Social History   Tobacco Use  . Smoking status: Former Smoker    Types: Cigarettes    Quit date: 06/24/2013    Years since quitting: 6.9  . Smokeless tobacco: Never Used  Vaping Use  . Vaping Use: Every day  Substance Use Topics  . Alcohol use: Yes  . Drug use: Never    Home Medications Prior to Admission medications   Medication Sig Start Date End Date Taking? Authorizing Provider  amphetamine-dextroamphetamine (ADDERALL) 30 MG tablet Take 1 tablet by mouth daily before breakfast. 05/31/20 06/30/20   Pucilowski, Roosvelt Maser, MD  ibuprofen (ADVIL,MOTRIN) 200 MG tablet Take 200 mg by mouth every 6 (six) hours as needed.    [provider]    Allergies    Sulfa antibiotics  Review of Systems   Review of Systems  All other systems reviewed and are negative.   Physical Exam Updated Vital Signs BP (!) 161/101 (BP Location: Left Arm)   Pulse (!) 107   Temp 98.8 F (37.1 C)   Resp 19   Ht 6\' 4"  (1.93 m)   Wt 103 kg   SpO2 97%   BMI 27.63 kg/m   Physical Exam Vitals and nursing note reviewed.  Constitutional:      General: He is not in acute distress.    Appearance: He is well-developed. He is not ill-appearing.  HENT:     Head: Normocephalic and atraumatic.  Eyes:     Conjunctiva/sclera: Conjunctivae normal.  Cardiovascular:     Rate and Rhythm: Normal rate.  Pulmonary:     Effort: Pulmonary effort is normal. No respiratory distress.  Abdominal:     General: There is no distension.  Genitourinary:    Comments: Chaperone present Musculoskeletal:     Cervical back: Neck supple.     Comments: Moves all extremities  Skin:    General: Skin  is warm and dry.  Neurological:     Mental Status: He is alert and oriented to person, place, and time.  Psychiatric:        Mood and Affect: Mood normal.        Behavior: Behavior normal.     ED Results / Procedures / Treatments   Labs (all labs ordered are listed, but only abnormal results are displayed) Labs Reviewed - No data to display  EKG None  Radiology DG Pelvis 1-2 Views  Result Date: 06/01/2020 CLINICAL DATA:  Foreign body in rectum. EXAM: PELVIS - 1-2 VIEW COMPARISON:  None. FINDINGS: There is no evidence of pelvic fracture or diastasis. An 8 mm benign-appearing sclerotic focus is seen within the lateral aspect of the right femoral head. A 5.5 cm x 6.0 cm round area of mildly increased opacification is seen overlying the inferior aspect of the sacrum, along the midline. IMPRESSION: Area of mildly  increased opacification overlying the inferior aspect of the sacrum, along the midline which may represent a foreign structure within the distal sigmoid colon. Correlation with physical examination is recommended. Electronically Signed   By: Aram Candela M.D.   On: 06/01/2020 01:52    Procedures Procedures (including critical care time)  Medications Ordered in ED Medications - No data to display  ED Course  I have reviewed the triage vital signs and the nursing notes.  Pertinent labs & imaging results that were available during my care of the patient were reviewed by me and considered in my medical decision making (see chart for details).    MDM Rules/Calculators/A&P                          Patient here with rectal foreign body.  He has a silicone dildo approximately 6" in the rectum.  He has been unable to get it out.    I am able to palpate the base barely, but cannot reach it for removal.    Case discussed with Dr. Maisie Fus, who says admit to [facility], MD and he will be taken to the OR sometime later today.  NPO.  Fluids ordered.  PRN pain meds.  Final Clinical Impression(s) / ED Diagnoses Final diagnoses:  Foreign body of rectum, initial encounter    Rx / DC Orders ED Discharge Orders    None       Roxy Horseman, PA-C 06/01/20 0224    Mesner, Barbara Cower, MD 06/01/20 [phone]

## 2020-06-01 NOTE — H&P (Signed)
Jason Murray is an 34 y.o. male.    [facility]  Chief Complaint: foreign body in rectum  HPI: Patient is a 34 yo male who introduced a dildo into the rectum last evening.  Unable to retrieve.  Wife attempted with retrieve with some bleeding.  Presented to ER for evaluation.  Admitted to surgical service for removal under anesthesia.  No further bleeding overnight.  Denies abdominal pain.  Mild rectal discomfort.  Works in [employer].  No prior abdominal or rectal surgery.  Past Medical History:  Diagnosis Date  . GERD (gastroesophageal reflux disease)     History reviewed. No pertinent surgical history.  Family History  Problem Relation Age of Onset  . Diabetes Mother   . Arthritis Father   . Hearing loss Father   . Hypertension Father   . Prostate cancer Neg Hx   . Colon cancer Neg Hx    Social History:  reports that he quit smoking about 6 years ago. His smoking use included cigarettes. He has never used smokeless tobacco. He reports current alcohol use. He reports that he does not use drugs.  Allergies:  Allergies  Allergen Reactions  . Sulfa Antibiotics Swelling    Medications Prior to Admission  Medication Sig Dispense Refill  . amphetamine-dextroamphetamine (ADDERALL) 30 MG tablet Take 1 tablet by mouth daily before breakfast. 30 tablet 0  . ibuprofen (ADVIL,MOTRIN) 200 MG tablet Take 600-800 mg by mouth every 6 (six) hours as needed for mild pain.      Results for orders placed or performed during the hospital encounter of 06/01/20 (from the past 48 hour(s))  Resp Panel by RT-PCR (Flu A&B, Covid) Nasopharyngeal Swab     Status: None   Collection Time: 06/01/20  2:21 AM   Specimen: Nasopharyngeal Swab; Nasopharyngeal(NP) swabs in vial transport medium  Result Value Ref Range   SARS Coronavirus 2 by RT PCR NEGATIVE NEGATIVE    Comment: (NOTE) SARS-CoV-2 target nucleic acids are NOT DETECTED.  The SARS-CoV-2 RNA is  generally detectable in upper respiratory specimens during the acute phase of infection. The lowest concentration of SARS-CoV-2 viral copies this assay can detect is 138 copies/mL. A negative result does not preclude SARS-Cov-2 infection and should not be used as the sole basis for treatment or other patient management decisions. A negative result may occur with  improper specimen collection/handling, submission of specimen other than nasopharyngeal swab, presence of viral mutation(s) within the areas targeted by this assay, and inadequate number of viral copies(<138 copies/mL). A negative result must be combined with clinical observations, patient history, and epidemiological information. The expected result is Negative.  Fact Sheet for Patients:  BloggerCourse.com  Fact Sheet for Healthcare Providers:  SeriousBroker.it  This test is no t yet approved or cleared by the [place] FDA and  has been authorized for detection and/or diagnosis of SARS-CoV-2 by FDA under an Emergency Use Authorization (EUA). This EUA will remain  in effect (meaning this test can be used) for the duration of the COVID-19 declaration under Section 564(b)(1) of the Act, 21 U.S.C.section 360bbb-3(b)(1), unless the authorization is terminated  or revoked sooner.       Influenza A by PCR NEGATIVE NEGATIVE   Influenza B by PCR NEGATIVE NEGATIVE    Comment: (NOTE) The Xpert Xpress SARS-CoV-2/FLU/RSV plus assay is intended as an aid in the diagnosis of influenza from Nasopharyngeal swab specimens and should not be used as a sole basis for treatment. Nasal washings and  aspirates are unacceptable for Xpert Xpress SARS-CoV-2/FLU/RSV testing.  Fact Sheet for Patients: BloggerCourse.com  Fact Sheet for Healthcare Providers: SeriousBroker.it  This test is not yet approved or cleared by the [place] FDA  and has been authorized for detection and/or diagnosis of SARS-CoV-2 by FDA under an Emergency Use Authorization (EUA). This EUA will remain in effect (meaning this test can be used) for the duration of the COVID-19 declaration under Section 564(b)(1) of the Act, 21 U.S.C. section 360bbb-3(b)(1), unless the authorization is terminated or revoked.  Performed at [facility], [address] [place], [place] [address]   CBC with Differential/Platelet     Status: None   Collection Time: 06/01/20  2:25 AM  Result Value Ref Range   WBC 8.5 4.0 - 10.5 K/uL   RBC 4.98 4.22 - 5.81 MIL/uL   Hemoglobin 15.1 13.0 - 17.0 g/dL   HCT 09.6 43.8 - 38.1 %   MCV 86.9 80.0 - 100.0 fL   MCH 30.3 26.0 - 34.0 pg   MCHC 34.9 30.0 - 36.0 g/dL   RDW 84.0 37.5 - 43.6 %   Platelets 222 150 - 400 K/uL   nRBC 0.0 0.0 - 0.2 %   Neutrophils Relative % 75 %   Neutro Abs 6.4 1.7 - 7.7 K/uL   Lymphocytes Relative 14 %   Lymphs Abs 1.2 0.7 - 4.0 K/uL   Monocytes Relative 6 %   Monocytes Absolute 0.5 0.1 - 1.0 K/uL   Eosinophils Relative 4 %   Eosinophils Absolute 0.3 0.0 - 0.5 K/uL   Basophils Relative 1 %   Basophils Absolute 0.1 0.0 - 0.1 K/uL   Immature Granulocytes 0 %   Abs Immature Granulocytes 0.03 0.00 - 0.07 K/uL    Comment: Performed at [facility], [address], [place], [place] [address]  Basic metabolic panel     Status: Abnormal   Collection Time: 06/01/20  2:25 AM  Result Value Ref Range   Sodium 135 135 - 145 mmol/L   Potassium 4.0 3.5 - 5.1 mmol/L   Chloride 101 98 - 111 mmol/L   CO2 22 22 - 32 mmol/L   Glucose, Bld 104 (H) 70 - 99 mg/dL    Comment: Glucose reference range applies only to samples taken after fasting for at least 8 hours.   BUN 12 6 - 20 mg/dL   Creatinine, Ser 3.40 0.61 - 1.24 mg/dL   Calcium 9.3 8.9 - 35.2 mg/dL   GFR, Estimated >48 >18 mL/min    Comment: (NOTE) Calculated using the CKD-EPI Creatinine Equation (2021)     Anion gap 12 5 - 15    Comment: Performed at [facility], [address], [place], [place] [address]   DG Pelvis 1-2 Views  Result Date: 06/01/2020 CLINICAL DATA:  Foreign body in rectum. EXAM: PELVIS - 1-2 VIEW COMPARISON:  None. FINDINGS: There is no evidence of pelvic fracture or diastasis. An 8 mm benign-appearing sclerotic focus is seen within the lateral aspect of the right femoral head. A 5.5 cm x 6.0 cm round area of mildly increased opacification is seen overlying the inferior aspect of the sacrum, along the midline. IMPRESSION: Area of mildly increased opacification overlying the inferior aspect of the sacrum, along the midline which may represent a foreign structure within the distal sigmoid colon. Correlation with physical examination is recommended. Electronically Signed   By: Aram Candela M.D.   On: 06/01/2020 01:52    Review of  Systems  Constitutional: Negative.   HENT: Negative.   Eyes: Negative.   Respiratory: Negative.   Cardiovascular: Negative.   Gastrointestinal: Positive for rectal pain.  Endocrine: Negative.   Genitourinary: Negative.   Musculoskeletal: Negative.   Skin: Negative.   Allergic/Immunologic: Negative.   Neurological: Negative.   Hematological: Negative.   Psychiatric/Behavioral: Positive for behavioral problems.    EXAM:  Blood pressure (!) 145/91, pulse 96, temperature 98.2 F (36.8 C), temperature source Oral, resp. rate 18, height 6\' 4"  (1.93 m), weight 103 kg, SpO2 98 %.  CONSTITUTIONAL: no acute distress; conversant; no obvious deformities  EYES: conjunctiva moist; no lid lag; anicteric; pupils equal bilaterally  NECK: trachea midline; no thyroid nodularity  LUNGS: respiratory effort normal & unlabored; no wheeze; no rales; no tactile fremitus  CV: rate and rhythm regular; no palpable thrills; no murmur; no edema bilat lower extremities  GI: abdomen soft without distension; non-tender; no  hepatosplenomegaly; no obvious hernia  MSK: normal range of motion of extremities; no clubbing; no cyanosis  PSYCH: appropriate affect for situation; alert and oriented to person, place, & time  LYMPHATIC: no palpable cervical lymphadenopathy; no evidence lymphedema in extremities    Assessment/Plan Foreign body in rectum with bleeding  Plan exam under anesthesia and extraction in OR later today  Discussed procedure with patient.  Discussed possible need for more extensive surgery if damage or perforation to rectum or sigmoid colon.  The risks and benefits of the procedure have been discussed at length with the patient.  The patient understands the proposed procedure, potential alternative treatments, and the course of recovery to be expected.  All of the patient's questions have been answered at this time.  The patient wishes to proceed with surgery.  , MD [facility] Office: [facility]    [phone], MD 06/01/2020, 8:05 AM

## 2020-06-01 NOTE — Transfer of Care (Signed)
Immediate Anesthesia Transfer of Care Note  Patient: Jason Murray  Procedure(s) Performed: EXAM UNDER ANESTHESIA, REMOVAL OF FOREIGN BODY RECTUM (N/A )  Patient Location: PACU  Anesthesia Type:General  Level of Consciousness: awake, alert , oriented and patient cooperative  Airway & Oxygen Therapy: Patient Spontanous Breathing and Patient connected to face mask oxygen  Post-op Assessment: Report given to RN, Post -op Vital signs reviewed and stable and Patient moving all extremities  Post vital signs: Reviewed and stable  Last Vitals:  Vitals Value Taken Time  BP 145/102 06/01/20 1445  Temp    Pulse 82 06/01/20 1452  Resp 15 06/01/20 1452  SpO2 99 % 06/01/20 1452  Vitals shown include unvalidated device data.  Last Pain:  Vitals:   06/01/20 1435  TempSrc:   PainSc: 0-No pain      Patients Stated Pain Goal: 2 (06/01/20 8032)  Complications: No complications documented.

## 2020-06-01 NOTE — Discharge Instructions (Signed)
  [facility] -- DISCHARGE INSTRUCTIONS  REMINDER:   Carry a list of your medications and allergies with you at all times  Call your pharmacy at least 1 week in advance to refill prescriptions  Do not mix any prescribed pain medicine with alcohol  Do not drive any motor vehicles while taking pain medication  Take medications with food unless otherwise directed  Follow-up appointments (date to return to physician): Please call [phone] to confirm your follow up appointment with your surgeon.  Call your Surgeon if you have:  Temperature greater than 101.0  Persistent nausea and vomiting  Severe uncontrolled pain  Redness, tenderness, or signs of infection (pain, swelling, redness, odor or green/yellow discharge around the site)  Difficulty breathing, headache or visual disturbances  Hives  Persistent dizziness or light-headedness  Any other questions or concerns you may have after discharge  In an emergency, call 911 or go to an Emergency Department at a nearby hospital.  Diet: Begin with liquids, and if they are tolerated, resume your usual diet.  Avoid spicy, greasy or heavy foods.  If you have nausea or vomiting, go back to liquids.  If you cannot keep liquids down, call your doctor.  Avoid alcohol consumption while on prescription pain medications. Good nutrition promotes healing. Increase fiber and fluids.   ADDITIONAL INSTRUCTIONS: Recommend stool softener for 1 week.  Tub soaks or sitz baths as needed for discomfort.  [facility] Office: [phone]

## 2020-06-01 NOTE — Anesthesia Postprocedure Evaluation (Signed)
Anesthesia Post Note  Patient: Jason Murray  Procedure(s) Performed: EXAM UNDER ANESTHESIA, REMOVAL OF FOREIGN BODY RECTUM (N/A )     Patient location during evaluation: PACU Anesthesia Type: General Level of consciousness: awake and alert Pain management: pain level controlled Vital Signs Assessment: post-procedure vital signs reviewed and stable Respiratory status: spontaneous breathing, nonlabored ventilation, respiratory function stable and patient connected to nasal cannula oxygen Cardiovascular status: blood pressure returned to baseline and stable Postop Assessment: no apparent nausea or vomiting Anesthetic complications: no   No complications documented.  Last Vitals:  Vitals:   06/01/20 1500 06/01/20 1516  BP: (!) 141/102 (!) 141/81  Pulse: 81 77  Resp: 16 18  Temp:  36.5 C  SpO2: 99% 100%    Last Pain:  Vitals:   06/01/20 1516  TempSrc: Oral  PainSc:                  Shelton Silvas

## 2020-06-01 NOTE — Progress Notes (Signed)
Discharge instructions given to patient and all questions were answered.  

## 2020-06-01 NOTE — Anesthesia Preprocedure Evaluation (Signed)
Anesthesia Evaluation  Patient identified by MRN, date of birth, ID band Patient awake    Reviewed: Allergy & Precautions, NPO status , Patient's Chart, lab work & pertinent test results  Airway Mallampati: I  TM Distance: >3 FB Neck ROM: Full    Dental no notable dental hx.    Pulmonary former smoker,    Pulmonary exam normal        Cardiovascular negative cardio ROS Normal cardiovascular exam     Neuro/Psych PSYCHIATRIC DISORDERS negative neurological ROS     GI/Hepatic Neg liver ROS, GERD  ,  Endo/Other  negative endocrine ROS  Renal/GU negative Renal ROS     Musculoskeletal negative musculoskeletal ROS (+)   Abdominal Normal abdominal exam  (+)   Peds  Hematology negative hematology ROS (+)   Anesthesia Other Findings   Reproductive/Obstetrics                             Anesthesia Physical Anesthesia Plan  ASA: II  Anesthesia Plan: General   Post-op Pain Management:    Induction: Intravenous  PONV Risk Score and Plan: 3 and Ondansetron, Dexamethasone and Midazolam  Airway Management Planned: Oral ETT  Additional Equipment: None  Intra-op Plan:   Post-operative Plan: Extubation in OR  Informed Consent: I have reviewed the patients History and Physical, chart, labs and discussed the procedure including the risks, benefits and alternatives for the proposed anesthesia with the patient or authorized representative who has indicated his/her understanding and acceptance.     Dental advisory given  Plan Discussed with: CRNA  Anesthesia Plan Comments:         Anesthesia Quick Evaluation

## 2020-06-01 NOTE — Anesthesia Procedure Notes (Addendum)
Procedure Name: Intubation Date/Time: 06/01/2020 2:13 PM Performed by: West Pugh, CRNA Pre-anesthesia Checklist: Patient identified, Emergency Drugs available, Suction available, Patient being monitored and Timeout performed Patient Re-evaluated:Patient Re-evaluated prior to induction Oxygen Delivery Method: Circle system utilized Preoxygenation: Pre-oxygenation with 100% oxygen Induction Type: IV induction, Cricoid Pressure applied and Rapid sequence Laryngoscope Size: Mac and 4 Grade View: Grade I Tube type: Oral Tube size: 8.0 mm Number of attempts: 1 Airway Equipment and Method: Stylet Placement Confirmation: ETT inserted through vocal cords under direct vision,  positive ETCO2 and breath sounds checked- equal and bilateral Secured at: 22 cm Tube secured with: Tape Dental Injury: Teeth and Oropharynx as per pre-operative assessment

## 2020-06-01 NOTE — ED Triage Notes (Addendum)
Pt reports that there is a 5-6 in dildo with a suction cup on the end in his rectum. States that they tried to get it out and he started having some rectal bleeding.

## 2020-06-01 NOTE — Op Note (Signed)
Operative Note  Pre-operative Diagnosis:  Foreign body in rectum  Post-operative Diagnosis:  same  Surgeon:  Darnell Level, MD  Assistant:  none   Procedure:  Exam under anesthesia, extraction of foreign body from rectum  Anesthesia:  general  Estimated Blood Loss:  none  Drains: none         Specimen: removed device returned to patient  Indications:  Patient is a 35 yo male who introduced a dildo into the rectum last evening.  Unable to retrieve.  Wife attempted with retrieve with some bleeding.  Presented to ER for evaluation.  Admitted to surgical service for removal under anesthesia.  No further bleeding overnight.  Denies abdominal pain.  Mild rectal discomfort.  Patient now comes to the OR for exam under anesthesia and removal of foreign body from rectum.  Procedure:  The patient was seen in the pre-op holding area. The risks, benefits, complications, treatment options, and expected outcomes were previously discussed with the patient. The patient agreed with the proposed plan and has signed the informed consent form.  The patient was brought to the operating room by the surgical team, identified as Charlann Boxer and the procedure verified. A "time out" was completed and the above information confirmed.  Following administration of general anesthesia, the patient is place in lithotomy and prepped and draped in the usual aseptic fashion.  After time-out, digital rectal exam is performed.  A firm rubber device is palpable in the high rectum.  Using a ring forceps, the inferior edge of the device is grasped and gently pulled towards the anal canal while lifting the lower edge with a finger.  The device is gently delivered through the anal canal and removed completely.  It was placed into a specimen bag and will be returned to the patient.  Follow up exam shows no sign of laceration or injury, and no sign of bleeding.  Patient is awakened and transferred to the PACU in stable  condition.   Darnell Level, MD [facility] Office: [phone]

## 2020-06-02 ENCOUNTER — Encounter (HOSPITAL_COMMUNITY): Payer: Self-pay | Admitting: Surgery

## 2020-06-02 NOTE — Discharge Summary (Signed)
    Physician Discharge Summary [facility]  Patient ID: Jason Murray MRN: [id number] DOB/AGE: 11/06/1985 34 y.o.  Admit date: 06/01/2020  Discharge date: 06/01/2020  Discharge Diagnoses:  Principal Problem:   Rectal foreign body   Discharged Condition: good  Hospital Course:  Patient admitted from ER to surgical service for management of foreign body in rectum.  Patient prepared for OR and underwent exam under anesthesia with removal of foreign body.  Patient stable post op and discharged home.  Consults: None  Treatments: surgery: exam under anesthesia, removal of foreign body from rectum  Discharge Exam: Blood pressure (!) 141/81, pulse 77, temperature 97.7 F (36.5 C), temperature source Oral, resp. rate 18, height 6\' 4"  (1.93 m), weight 103 kg, SpO2 100 %. See progress notes.  Disposition: Home  Discharge Instructions    Diet - low sodium heart healthy   Complete by: As directed    Increase activity slowly   Complete by: As directed    No wound care   Complete by: As directed      Allergies as of 06/01/2020      Reactions   Sulfa Antibiotics Swelling      Medication List    TAKE these medications   amphetamine-dextroamphetamine 30 MG tablet Commonly known as: Adderall Take 1 tablet by mouth daily before breakfast.   ibuprofen 200 MG tablet Commonly known as: ADVIL Take 600-800 mg by mouth every 6 (six) hours as needed for mild pain.       Follow-up Information    [facility].   Specialty: General Surgery Why: As needed Contact information: [address] [place] [phone]              [phone], MD [facility] Office: [phone]   Signed: [phone] 06/02/2020, 9:45 AM

## 2020-06-07 ENCOUNTER — Telehealth (INDEPENDENT_AMBULATORY_CARE_PROVIDER_SITE_OTHER): Payer: 59 | Admitting: Psychiatry

## 2020-06-07 ENCOUNTER — Other Ambulatory Visit: Payer: Self-pay

## 2020-06-07 DIAGNOSIS — F909 Attention-deficit hyperactivity disorder, unspecified type: Secondary | ICD-10-CM

## 2020-06-07 MED ORDER — AMPHETAMINE-DEXTROAMPHETAMINE 30 MG PO TABS: 30.0000 mg | ORAL_TABLET | Freq: Every day | ORAL | 0 refills | Status: DC

## 2020-06-07 NOTE — Progress Notes (Signed)
BH MD/PA/NP OP Progress Note  06/07/2020 3:08 PM Jason Murray  MRN:  [id number] Interview was conducted by phone and I verified that I was speaking with the correct person using two identifiers. I discussed the limitations of evaluation and management by telemedicine and  the availability of in person appointments. Patient expressed understanding and agreed to proceed. Participants in the visit: patient (location - home); physician (location - home office).  Chief Complaint: None.  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 Steventried various doses ofIRAdderall on a bid schedule and realized that30mg  daily works well enough for ADD symptoms all day long. Hecannot take even 10 mg at noon as he cannot fall asleep later. 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  Visit Diagnosis:    ICD-10-CM   1. 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  Past Medical History:  Past Medical History:  Diagnosis Date  . GERD (gastroesophageal reflux disease)     Past Surgical History:  Procedure Laterality Date  . RECTAL EXAM UNDER ANESTHESIA N/A 06/01/2020   Procedure: EXAM UNDER ANESTHESIA, REMOVAL OF FOREIGN BODY RECTUM;  Surgeon: Darnell Level, MD;  Location: [facility];  Service: General;  Laterality: N/A;    Family Psychiatric History: None  Family History:  Family History  Problem Relation Age of Onset  . Diabetes Mother   . Arthritis Father   . Hearing loss Father   . Hypertension Father   . Prostate cancer Neg Hx   . Colon cancer Neg Hx     Social History:  Social History   Socioeconomic History  . Marital status: Married    Spouse name: Not on file  . Number of children: Not on file  . Years of education: Not on file  . Highest education level:  Not on file  Occupational History  . Not on file  Tobacco Use  . Smoking status: Former Smoker    Types: Cigarettes    Quit date: 06/24/2013    Years since quitting: 6.9  . Smokeless tobacco: Never Used  Vaping Use  . Vaping Use: Every day  Substance and Sexual Activity  . Alcohol use: Yes  . Drug use: Never  . Sexual activity: Yes  Other Topics Concern  . Not on file  Social History Narrative  . Not on file   Social Determinants of Health   Financial Resource Strain: Not on file  Food Insecurity: Not on file  Transportation Needs: Not on file  Physical Activity: Not on file  Stress: Not on file  Social Connections: Not on file    Allergies:  Allergies  Allergen Reactions  . Sulfa Antibiotics Swelling    Metabolic Disorder Labs: No results found for: HGBA1C, MPG No results found for: PROLACTIN No results found for: CHOL, TRIG, HDL, CHOLHDL, VLDL, LDLCALC Lab Results  Component Value Date   TSH 1.18 07/10/2018    Therapeutic Level Labs: No results found for: LITHIUM No results found for: VALPROATE No components found for:  CBMZ  Current Medications: Current Outpatient Medications  Medication Sig Dispense Refill  . [START ON 06/30/2020] amphetamine-dextroamphetamine (ADDERALL) 30 MG tablet Take 1 tablet by mouth daily before breakfast. 30 tablet 0  . [START ON 07/31/2020] amphetamine-dextroamphetamine (ADDERALL) 30 MG tablet Take 1 tablet by mouth daily before breakfast. 30 tablet  0  . [START ON 08/28/2020] amphetamine-dextroamphetamine (ADDERALL) 30 MG tablet Take 1 tablet by mouth daily before breakfast. 30 tablet 0  . ibuprofen (ADVIL,MOTRIN) 200 MG tablet Take 600-800 mg by mouth every 6 (six) hours as needed for mild pain.     No current facility-administered medications for this visit.      Psychiatric Specialty Exam: Review of Systems  All other systems reviewed and are negative.   There were no vitals taken for this visit.There is no height or  weight on file to calculate BMI.  General Appearance: NA  Eye Contact:  NA  Speech:  Clear and Coherent and Normal Rate  Volume:  Normal  Mood:  Euthymic  Affect:  NA  Thought Process:  Goal Directed and Linear  Orientation:  Full (Time, Place, and Person)  Thought Content: Logical   Suicidal Thoughts:  No  Homicidal Thoughts:  No  Memory:  Immediate;   Good Recent;   Good Remote;   Good  Judgement:  Good  Insight:  Good  Psychomotor Activity:  NA  Concentration:  Concentration: Good  Recall:  Good  Fund of Knowledge: Good  Language: Good  Akathisia:  Negative  Handed:  Right  AIMS (if indicated): not done  Assets:  Communication Skills Desire for Improvement Financial Resources/Insurance Housing Social Support Talents/Skills  ADL's:  Intact  Cognition: WNL  Sleep:  Good   Screenings: GAD-7   Flowsheet Row Office Visit from 07/10/2018 in [facility]  Total GAD-7 Score 5    PHQ2-9   Flowsheet Row Office Visit from 05/30/2020 in [facility] Office Visit from 07/10/2018 in [facility] from 06/24/2018 in [facility]  PHQ-2 Total Score 0 2 0  PHQ-9 Total Score -- 7 --       Assessment and Plan: 34yo married male with 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 Steventried various doses ofIRAdderall on a bid schedule and realized that30mg  daily works well enough for ADD symptoms all day long. Hecannot take even 10 mg at noon as he cannot fall asleep later. 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  Dx: ADHD adult, inattentive type  Plan: Continue Adderall 30 mg in AM.Ido will return for next visit in 3 months.The plan was discussed with patient who had an opportunity to ask  questions and these were all answered. I spend58minutes inphone consultation with the patient.   Magdalene Patricia, MD 06/07/2020, 3:08 PM

## 2020-06-14 ENCOUNTER — Ambulatory Visit (HOSPITAL_COMMUNITY): Payer: 59 | Admitting: Psychiatry

## 2020-08-17 ENCOUNTER — Ambulatory Visit (INDEPENDENT_AMBULATORY_CARE_PROVIDER_SITE_OTHER): Payer: 59 | Admitting: Family Medicine

## 2020-08-17 ENCOUNTER — Other Ambulatory Visit: Payer: Self-pay

## 2020-08-17 ENCOUNTER — Encounter: Payer: Self-pay | Admitting: Family Medicine

## 2020-08-17 ENCOUNTER — Ambulatory Visit (INDEPENDENT_AMBULATORY_CARE_PROVIDER_SITE_OTHER): Payer: 59

## 2020-08-17 VITALS — BP 146/86 | HR 83 | Temp 98.4°F | Ht 76.0 in | Wt 216.8 lb

## 2020-08-17 DIAGNOSIS — M549 Dorsalgia, unspecified: Secondary | ICD-10-CM | POA: Diagnosis not present

## 2020-08-17 DIAGNOSIS — G8929 Other chronic pain: Secondary | ICD-10-CM

## 2020-08-17 MED ORDER — TIZANIDINE HCL 4 MG PO TABS: 2.0000 mg | ORAL_TABLET | Freq: Four times a day (QID) | ORAL | 0 refills | Status: DC | PRN

## 2020-08-17 MED ORDER — MELOXICAM 15 MG PO TABS: 15.0000 mg | ORAL_TABLET | Freq: Every day | ORAL | 0 refills | Status: DC

## 2020-08-17 NOTE — Assessment & Plan Note (Signed)
Acute flare today.  No red flags.  Has been several years since he has had any sort of imaging.  Given prolonged nature we will update x-rays today.  We will start meloxicam and very low-dose Zanaflex.  Recommended heating pad.  It is also okay for him to get massage therapy.  If not improving in the next 1 to 2 weeks would consider referral to PT and/or sports medicine.

## 2020-08-17 NOTE — Progress Notes (Signed)
   Jason Murray is a 35 y.o. male who presents today for an office visit.  Assessment/Plan:  Chronic Problems Addressed Today: Chronic back pain Acute flare today.  No red flags.  Has been several years since he has had any sort of imaging.  Given prolonged nature we will update x-rays today.  We will start meloxicam and very low-dose Zanaflex.  Recommended heating pad.  It is also okay for him to get massage therapy.  If not improving in the next 1 to 2 weeks would consider referral to PT and/or sports medicine.     Subjective:  HPI:  Patient here with flareup of chronic back pain.  Located in the left mid back.  Will radiate into his and sometimes causes headaches.  He has tried taking ibuprofen which works short-term but then wears off.  He has been doing different kinds of workout recently and thinks this may have flared it up.  He has had this ongoing for several years.  Pain is pretty persistent for the time being.  He has had massage therapy the past which is worked well for him.  No reported weakness or numbness.       Objective:  Physical Exam: BP (!) 146/86   Pulse 83   Temp 98.4 F (36.9 C) (Temporal)   Ht 6\' 4"  (1.93 m)   Wt 216 lb 12.8 oz (98.3 kg)   SpO2 100%   BMI 26.39 kg/m   Gen: No acute distress, resting comfortably MSK:  - Back: No deformities.  Tender to palpation along left thoracic paraspinal muscles.  Neurovascularly intact distally -Legs: Strength 5 out of 5 throughout.  Reflexes 2+ and symmetric bilaterally. Neuro: Grossly normal, moves all extremities Psych: Normal affect and thought content      Dearl Rudden M. , MD 08/17/2020 8:34 AM

## 2020-08-17 NOTE — Patient Instructions (Signed)
It was very nice to see you today!  You have inflammation in the muscles in your back.  Please start the meloxicam and Zanaflex.  We will check x-rays today.  Let me know if not improving over the next 1 to 2 weeks.  Take care, Dr Jimmey Ralph  Please try these tips to maintain a healthy lifestyle:   Eat at least 3 REAL meals and 1-2 snacks per day.  Aim for no more than 5 hours between eating.  If you eat breakfast, please do so within one hour of getting up.    Each meal should contain half fruits/vegetables, one quarter protein, and one quarter carbs (no bigger than a computer mouse)   Cut down on sweet beverages. This includes juice, soda, and sweet tea.     Drink at least 1 glass of water with each meal and aim for at least 8 glasses per day   Exercise at least 150 minutes every week.

## 2020-08-19 NOTE — Progress Notes (Signed)
Please inform patient of the following:  Xray shows no acute abnormalities. Would like for him to let us know if his symptoms are not improving.  [name]. Jimmey Ralph, MD 08/19/2020 4:02 PM

## 2020-09-09 ENCOUNTER — Ambulatory Visit (INDEPENDENT_AMBULATORY_CARE_PROVIDER_SITE_OTHER): Payer: 59 | Admitting: Psychiatry

## 2020-09-09 ENCOUNTER — Other Ambulatory Visit: Payer: Self-pay

## 2020-09-09 DIAGNOSIS — F909 Attention-deficit hyperactivity disorder, unspecified type: Secondary | ICD-10-CM

## 2020-09-09 MED ORDER — AMPHETAMINE-DEXTROAMPHETAMINE 30 MG PO TABS: 30.0000 mg | ORAL_TABLET | Freq: Every day | ORAL | 0 refills | Status: DC

## 2020-09-09 MED ORDER — AMPHETAMINE-DEXTROAMPHETAMINE 30 MG PO TABS
30.0000 mg | ORAL_TABLET | Freq: Every day | ORAL | 0 refills | Status: DC
Start: 2020-09-27 — End: 2020-12-06

## 2020-09-09 NOTE — Progress Notes (Signed)
BH MD/PA/NP OP Progress Note  09/09/2020 8:52 AM Jason Murray  MRN:  [id number] Interview was conducted by phone and I verified that I was speaking with the correct person using two identifiers. I discussed the limitations of evaluation and management by telemedicine and  the availability of in person appointments. Patient expressed understanding and agreed to proceed. Participants in the visit: patient (location - home); physician (location - home office).  Chief Complaint: "Things are going well".  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 Steventried various doses ofIRAdderall on a bid schedule and realized that30mg  daily works well enough for ADD symptoms all day long. Hecannot take even 10 mg at noon as he cannot fall asleep later. 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   Visit Diagnosis:    ICD-10-CM   1. 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  Past Medical History:  Past Medical History:  Diagnosis Date  . GERD (gastroesophageal reflux disease)     Past Surgical History:  Procedure Laterality Date  . RECTAL EXAM UNDER ANESTHESIA N/A 06/01/2020   Procedure: EXAM UNDER ANESTHESIA, REMOVAL OF FOREIGN BODY RECTUM;  Surgeon: Darnell Level, MD;  Location: [facility];  Service: General;  Laterality: N/A;    Family Psychiatric History: None.  Family History:  Family History  Problem Relation Age of Onset  . Diabetes Mother   . Arthritis Father   . Hearing loss Father   . Hypertension Father   . Prostate cancer Neg Hx   . Colon cancer Neg Hx     Social History:  Social History   Socioeconomic History  . Marital status: Married    Spouse name: Not on file  . Number of children: Not on file  .  Years of education: Not on file  . Highest education level: Not on file  Occupational History  . Not on file  Tobacco Use  . Smoking status: Former Smoker    Types: Cigarettes    Quit date: 06/24/2013    Years since quitting: 7.2  . Smokeless tobacco: Never Used  Vaping Use  . Vaping Use: Every day  Substance and Sexual Activity  . Alcohol use: Yes  . Drug use: Never  . Sexual activity: Yes  Other Topics Concern  . Not on file  Social History Narrative  . Not on file   Social Determinants of Health   Financial Resource Strain: Not on file  Food Insecurity: Not on file  Transportation Needs: Not on file  Physical Activity: Not on file  Stress: Not on file  Social Connections: Not on file    Allergies:  Allergies  Allergen Reactions  . Sulfa Antibiotics Swelling    Metabolic Disorder Labs: No results found for: HGBA1C, MPG No results found for: PROLACTIN No results found for: CHOL, TRIG, HDL, CHOLHDL, VLDL, LDLCALC Lab Results  Component Value Date   TSH 1.18 07/10/2018    Therapeutic Level Labs: No results found for: LITHIUM No results found for: VALPROATE No components found for:  CBMZ  Current Medications: Current Outpatient Medications  Medication Sig Dispense Refill  . amphetamine-dextroamphetamine (ADDERALL) 30 MG tablet Take 1 tablet by mouth daily before breakfast. 30 tablet 0  . [START ON 10/27/2020] amphetamine-dextroamphetamine (ADDERALL) 30 MG  tablet Take 1 tablet by mouth daily before breakfast. 30 tablet 0  . [START ON 09/27/2020] amphetamine-dextroamphetamine (ADDERALL) 30 MG tablet Take 1 tablet by mouth daily before breakfast. 30 tablet 0  . ibuprofen (ADVIL,MOTRIN) 200 MG tablet Take 600-800 mg by mouth every 6 (six) hours as needed for mild pain.    . meloxicam (MOBIC) 15 MG tablet Take 1 tablet (15 mg total) by mouth daily. 30 tablet 0  . tiZANidine (ZANAFLEX) 4 MG tablet Take 0.5-1 tablets (2-4 mg total) by mouth every 6 (six) hours as needed  for muscle spasms. 30 tablet 0   No current facility-administered medications for this visit.     Psychiatric Specialty Exam: Review of Systems  All other systems reviewed and are negative.   There were no vitals taken for this visit.There is no height or weight on file to calculate BMI.  General Appearance: NA  Eye Contact:  NA  Speech:  Clear and Coherent and Normal Rate  Volume:  Normal  Mood:  Euthymic  Affect:  NA  Thought Process:  Goal Directed and Linear  Orientation:  Full (Time, Place, and Person)  Thought Content: Logical   Suicidal Thoughts:  No  Homicidal Thoughts:  No  Memory:  Immediate;   Good Recent;   Good Remote;   Good  Judgement:  Good  Insight:  Good  Psychomotor Activity:  NA  Concentration:  Concentration: Good and Attention Span: Good  Recall:  Good  Fund of Knowledge: Good  Language: Good  Akathisia:  Negative  Handed:  Right  AIMS (if indicated): not done  Assets:  Communication Skills Desire for Improvement Financial Resources/Insurance Housing Social Support Talents/Skills  ADL's:  Intact  Cognition: WNL  Sleep:  Good   Screenings: GAD-7   Flowsheet Row Office Visit from 07/10/2018 in [facility]  Total GAD-7 Score 5    PHQ2-9   Flowsheet Row Office Visit from 05/30/2020 in [facility] Office Visit from 07/10/2018 in [facility] from 06/24/2018 in [facility]  PHQ-2 Total Score 0 2 0  PHQ-9 Total Score -- 7 --       Assessment and Plan: 35yo married male with 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 Steventried various doses ofIRAdderall on a bid schedule and realized that30mg  daily works well enough for ADD symptoms all day long. Hecannot take even 10 mg at noon as he cannot fall asleep later. 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  Dx: ADHD adult, inattentive type  Plan: Continue Adderall 30 mg in AM.Jason Murray will return for next visit in 3 months.The plan was discussed with patient who had an opportunity to ask questions and these were all answered. I spend66minutes inphone consultation with the patient.   Magdalene Patricia, MD 09/09/2020, 8:52 AM

## 2020-11-08 ENCOUNTER — Telehealth (HOSPITAL_COMMUNITY): Payer: Self-pay | Admitting: *Deleted

## 2020-11-08 NOTE — Telephone Encounter (Signed)
Placed call to patient to inform of cancelled appointment and inability to continue to provide services. Patient unavailable.  Left message.  Patient has meds to last until 11/26/20.

## 2020-11-17 ENCOUNTER — Telehealth (HOSPITAL_COMMUNITY): Payer: 59 | Admitting: Psychiatry

## 2020-12-06 ENCOUNTER — Telehealth (INDEPENDENT_AMBULATORY_CARE_PROVIDER_SITE_OTHER): Payer: 59 | Admitting: Family Medicine

## 2020-12-06 ENCOUNTER — Encounter: Payer: Self-pay | Admitting: Family Medicine

## 2020-12-06 VITALS — Ht 76.0 in | Wt 223.0 lb

## 2020-12-06 DIAGNOSIS — F909 Attention-deficit hyperactivity disorder, unspecified type: Secondary | ICD-10-CM | POA: Diagnosis not present

## 2020-12-06 DIAGNOSIS — M549 Dorsalgia, unspecified: Secondary | ICD-10-CM

## 2020-12-06 DIAGNOSIS — G8929 Other chronic pain: Secondary | ICD-10-CM

## 2020-12-06 MED ORDER — AMPHETAMINE-DEXTROAMPHETAMINE 30 MG PO TABS: 30.0000 mg | ORAL_TABLET | Freq: Every day | ORAL | 0 refills | Status: DC

## 2020-12-06 NOTE — Assessment & Plan Note (Signed)
Previous psychiatrist retired.  He request that we take over his ADHD prescriptions.  He has been on Adderall 30 mg daily for multiple years and has done well.  Medications help with his ability to stay focused and on task.  No side effects.  Database reviewed without red flags.  We will refill his Adderall today and take over ongoing prescription.  Discussed controlled substance policies.  Follow-up in 6 months.

## 2020-12-06 NOTE — Progress Notes (Addendum)
   Jason Murray is a 35 y.o. male who presents today for a virtual office visit.  Assessment/Plan:  Chronic Problems Addressed Today: Chronic back pain Now seeing specialist at [facility] hospital.  Symptoms are stable but still bothersome.  We treated him for an acute flare of his chronic low back pain a few months ago with Mobic and Zanaflex.  These medications help manage his pain and symptoms.  We will write letter today stating that we treated him per patient request.  Adult ADHD Previous psychiatrist retired.  He request that we take over his ADHD prescriptions.  He has been on Adderall 30 mg daily for multiple years and has done well.  Medications help with his ability to stay focused and on task.  No side effects.  Database reviewed without red flags.  We will refill his Adderall today and take over ongoing prescription.  Discussed controlled substance policies.  Follow-up in 6 months.    Subjective:  HPI:  See A/p.         Objective/Observations  Physical Exam: Gen: NAD, resting comfortably Pulm: Normal work of breathing Neuro: Grossly normal, moves all extremities Psych: Normal affect and thought content  Virtual Visit via Video   I connected with Jason Murray on 12/06/20 at  4:00 PM EDT by a video enabled telemedicine application and verified that I am speaking with the correct person using two identifiers. The limitations of evaluation and management by telemedicine and the availability of in person appointments were discussed. The patient expressed understanding and agreed to proceed.   Patient location: Home Provider location: [place] [facility] Persons participating in the virtual visit: Myself and Patient     Jason Murray. Jason Ralph, MD 12/06/2020 10:10 AM

## 2020-12-06 NOTE — Assessment & Plan Note (Addendum)
Now seeing specialist at [facility] hospital.  Symptoms are stable but still bothersome.  We treated him for an acute flare of his chronic low back pain a few months ago with Mobic and Zanaflex.  These medications help manage his pain and symptoms.  We will write letter today stating that we treated him per patient request.

## 2021-01-17 ENCOUNTER — Telehealth: Payer: Self-pay

## 2021-01-17 MED ORDER — AMPHETAMINE-DEXTROAMPHETAMINE 30 MG PO TABS: 30.0000 mg | ORAL_TABLET | Freq: Every day | ORAL | 0 refills | Status: DC

## 2021-01-17 NOTE — Telephone Encounter (Signed)
MEDICATION: amphetamine-dextroamphetamine (ADDERALL) 30 MG tablet  PHARMACY:  [facility] - [place], [place] - [address] Phone:  [phone]  Fax:  [phone]      Comments: [facility] now on for [facility] days. Patient stated it would need a PA.           **Let patient know to contact pharmacy at the end of the day to make sure medication is ready. **  ** Please notify patient to allow 48-72 hours to process**  **Encourage patient to contact the pharmacy for refills or they can request refills through [facility]**

## 2021-02-09 NOTE — Telephone Encounter (Signed)
Jason Murray has this been handled from 3 weeks ago?

## 2021-02-09 NOTE — Telephone Encounter (Signed)
Patients wife called and stated this still has not been sent for approval or prior auth    She also supplied a website to submit PA to [email or web address]   Please advise patient when it has been sent.

## 2021-02-14 NOTE — Telephone Encounter (Signed)
Have not received PA information from pharmacy

## 2021-02-20 NOTE — Telephone Encounter (Signed)
See below

## 2021-02-20 NOTE — Telephone Encounter (Signed)
Patient calling back about PA. Patient wife would like a call back

## 2021-02-23 NOTE — Telephone Encounter (Signed)
PA form received and fax Waiting for determination

## 2021-02-23 NOTE — Telephone Encounter (Signed)
Call patient insurance at [phone]  Covermymed form was done key  [id number] outcome N/A Per insurance they do not use covermymed and will fax correct form   Waiting for form to be fax

## 2021-03-01 NOTE — Telephone Encounter (Signed)
Pt's wife called to check the status of PA for Adderall. Please Advise.

## 2021-03-02 NOTE — Telephone Encounter (Signed)
Form faxed waiting for determination

## 2021-03-15 NOTE — Telephone Encounter (Signed)
I returned pt wife call and she states pt went to the pharmacy and is out of his adderral and she is wanting to follow up on the status of the PA with Francena Hanly. Informed pt wife I will have stella follow up with her/them regarding this.

## 2021-03-15 NOTE — Telephone Encounter (Signed)
Patients wife is calling and states there is some confusion please return call regarding medication.

## 2021-03-16 NOTE — Telephone Encounter (Signed)
Spoke with pharmacy  PA was sent do them and approved form 02/24/2021 till 02/23/2022

## 2021-03-16 NOTE — Telephone Encounter (Signed)
Patient notified, will call back for refills, patient unsure of pharmacy he will use

## 2021-03-17 ENCOUNTER — Other Ambulatory Visit: Payer: Self-pay

## 2021-03-17 MED ORDER — AMPHETAMINE-DEXTROAMPHETAMINE 30 MG PO TABS: 30.0000 mg | ORAL_TABLET | Freq: Every day | ORAL | 0 refills | Status: DC

## 2021-03-17 NOTE — Telephone Encounter (Signed)
.   Is requiring PA please call  [phone] to submit PA  Encourage patient to contact the pharmacy for refills or they can request refills through [facility]  LAST APPOINTMENT DATE:  Please schedule appointment if longer than 1 year  NEXT APPOINTMENT DATE:  MEDICATION:amphetamine-dextroamphetamine (ADDERALL) 30 MG tablet (Expired)  Is the patient out of medication?   PHARMACY:[facility] - [place], [place] - [address]  Let patient know to contact pharmacy at the end of the day to make sure medication is ready.  Please notify patient to allow 48-72 hours to process  CLINICAL FILLS OUT ALL BELOW:   LAST REFILL:  QTY:  REFILL DATE:    OTHER COMMENTS:    Okay for refill?  Please advise

## 2021-03-20 ENCOUNTER — Telehealth (INDEPENDENT_AMBULATORY_CARE_PROVIDER_SITE_OTHER): Payer: 59 | Admitting: Family Medicine

## 2021-03-20 ENCOUNTER — Encounter: Payer: Self-pay | Admitting: Family Medicine

## 2021-03-20 VITALS — Ht 76.0 in | Wt 218.0 lb

## 2021-03-20 DIAGNOSIS — F909 Attention-deficit hyperactivity disorder, unspecified type: Secondary | ICD-10-CM

## 2021-03-20 DIAGNOSIS — G8929 Other chronic pain: Secondary | ICD-10-CM

## 2021-03-20 DIAGNOSIS — M549 Dorsalgia, unspecified: Secondary | ICD-10-CM

## 2021-03-20 MED ORDER — AMPHETAMINE-DEXTROAMPHETAMINE 30 MG PO TABS
30.0000 mg | ORAL_TABLET | Freq: Every day | ORAL | 0 refills | Status: DC
Start: 2021-03-20 — End: 2021-06-29

## 2021-03-20 MED ORDER — AMPHETAMINE-DEXTROAMPHETAMINE 30 MG PO TABS
30.0000 mg | ORAL_TABLET | Freq: Every day | ORAL | 0 refills | Status: DC
Start: 2021-04-19 — End: 2021-10-23

## 2021-03-20 MED ORDER — AMPHETAMINE-DEXTROAMPHETAMINE 30 MG PO TABS: 30.0000 mg | ORAL_TABLET | Freq: Every day | ORAL | 0 refills | Status: DC

## 2021-03-20 NOTE — Assessment & Plan Note (Signed)
Overall stable.  Uses meloxicam very infrequently as needed.  Does not need refill today.

## 2021-03-20 NOTE — Assessment & Plan Note (Signed)
Stable.  Database no red flags.  He had some issues with insurance coverage but has prior authorization has recently been approved.  We will send in Adderall.  Follow-up in 3 to 6 months.  Medications help with ability to stay focused and on task.

## 2021-03-20 NOTE — Progress Notes (Signed)
   Jason Murray is a 35 y.o. male who presents today for a virtual office visit.  Assessment/Plan:  Chronic Problems Addressed Today: Chronic back pain Overall stable.  Uses meloxicam very infrequently as needed.  Does not need refill today.  Adult ADHD Stable.  Database no red flags.  He had some issues with insurance coverage but has prior authorization has recently been approved.  We will send in Adderall.  Follow-up in 3 to 6 months.  Medications help with ability to stay focused and on task.    Subjective:  HPI:  See A/p for status of chronic conditions.        Objective/Observations  Physical Exam: Gen: NAD, resting comfortably Pulm: Normal work of breathing Neuro: Grossly normal, moves all extremities Psych: Normal affect and thought content  Virtual Visit via Video   I connected with Jason Murray on 03/20/21 at  3:40 PM EDT by a video enabled telemedicine application and verified that I am speaking with the correct person using two identifiers. The limitations of evaluation and management by telemedicine and the availability of in person appointments were discussed. The patient expressed understanding and agreed to proceed.   Patient location: Home Provider location: [place] [facility] Persons participating in the virtual visit: Myself and Patient     [name]. Jimmey Ralph, MD 03/20/2021 3:40 PM

## 2021-04-04 ENCOUNTER — Encounter: Payer: 59 | Admitting: Family Medicine

## 2021-04-13 ENCOUNTER — Encounter: Payer: Self-pay | Admitting: Family Medicine

## 2021-04-13 ENCOUNTER — Other Ambulatory Visit: Payer: Self-pay

## 2021-04-13 ENCOUNTER — Ambulatory Visit (INDEPENDENT_AMBULATORY_CARE_PROVIDER_SITE_OTHER): Payer: 59 | Admitting: Family Medicine

## 2021-04-13 VITALS — BP 153/94 | HR 72 | Temp 97.6°F | Ht 76.0 in | Wt 217.4 lb

## 2021-04-13 DIAGNOSIS — G4700 Insomnia, unspecified: Secondary | ICD-10-CM

## 2021-04-13 DIAGNOSIS — R030 Elevated blood-pressure reading, without diagnosis of hypertension: Secondary | ICD-10-CM

## 2021-04-13 DIAGNOSIS — Z0001 Encounter for general adult medical examination with abnormal findings: Secondary | ICD-10-CM | POA: Diagnosis not present

## 2021-04-13 DIAGNOSIS — M549 Dorsalgia, unspecified: Secondary | ICD-10-CM

## 2021-04-13 DIAGNOSIS — F909 Attention-deficit hyperactivity disorder, unspecified type: Secondary | ICD-10-CM | POA: Diagnosis not present

## 2021-04-13 DIAGNOSIS — Z23 Encounter for immunization: Secondary | ICD-10-CM

## 2021-04-13 DIAGNOSIS — G8929 Other chronic pain: Secondary | ICD-10-CM

## 2021-04-13 MED ORDER — MELOXICAM 15 MG PO TABS: 15.0000 mg | ORAL_TABLET | Freq: Every day | ORAL | 0 refills | Status: DC

## 2021-04-13 NOTE — Patient Instructions (Signed)
It was very nice to see you today!  We will give you your flu shot today.  We can check blood work next time.  I will refill your meloxicam.  We will see back in 3-6 months for a medication check.  Please come back in 1 year for your next physical.  Take care, Dr Jimmey Ralph  PLEASE NOTE:  If you had any lab tests please let us know if you have not heard back within a few days. You may see your results on mychart before we have a chance to review them but we will give you a call once they are reviewed by [place]. If we ordered any referrals today, please let us know if you have not heard from their office within the next week.   Please try these tips to maintain a healthy lifestyle:  Eat at least 3 REAL meals and 1-2 snacks per day.  Aim for no more than 5 hours between eating.  If you eat breakfast, please do so within one hour of getting up.   Each meal should contain half fruits/vegetables, one quarter protein, and one quarter carbs (no bigger than a computer mouse)  Cut down on sweet beverages. This includes juice, soda, and sweet tea.   Drink at least 1 glass of water with each meal and aim for at least 8 glasses per day  Exercise at least 150 minutes every week.    Preventive Care 32-64 Years Old, Male Preventive care refers to lifestyle choices and visits with your health care provider that can promote health and wellness. This includes: A yearly physical exam. This is also called an annual wellness visit. Regular dental and eye exams. Immunizations. Screening for certain conditions. Healthy lifestyle choices, such as: Eating a healthy diet. Getting regular exercise. Not using drugs or products that contain nicotine and tobacco. Limiting alcohol use. What can I expect for my preventive care visit? Physical exam Your health care provider may check your: Height and weight. These may be used to calculate your BMI (body mass index). BMI is a measurement that tells if you are at a  healthy weight. Heart rate and blood pressure. Body temperature. Skin for abnormal spots. Counseling Your health care provider may ask you questions about your: Past medical problems. Family's medical history. Alcohol, tobacco, and drug use. Emotional well-being. Home life and relationship well-being. Sexual activity. Diet, exercise, and sleep habits. Work and work Astronomer. Access to firearms. What immunizations do I need? Vaccines are usually given at various ages, according to a schedule. Your health care provider will recommend vaccines for you based on your age, medical history, and lifestyle or other factors, such as travel or where you work. What tests do I need? Blood tests Lipid and cholesterol levels. These may be checked every 5 years starting at age 78. Hepatitis C test. Hepatitis B test. Screening  Diabetes screening. This is done by checking your blood sugar (glucose) after you have not eaten for a while (fasting). Genital exam to check for testicular cancer or hernias. STD (sexually transmitted disease) testing, if you are at risk. Talk with your health care provider about your test results, treatment options, and if necessary, the need for more tests. Follow these instructions at home: Eating and drinking  Eat a healthy diet that includes fresh fruits and vegetables, whole grains, lean protein, and low-fat dairy products. Drink enough fluid to keep your urine pale yellow. Take vitamin and mineral supplements as recommended by your health care provider.  Do not drink alcohol if your health care provider tells you not to drink. If you drink alcohol: Limit how much you have to 0-2 drinks a day. Be aware of how much alcohol is in your drink. In the [place] one drink equals one 12 oz bottle of beer (355 mL), one 5 oz glass of wine (148 mL), or one 1 oz glass of hard liquor (44 mL). Lifestyle Take daily care of your teeth and gums. Brush your teeth every morning and  night with fluoride toothpaste. Floss one time each day. Stay active. Exercise for at least 30 minutes 5 or more days each week. Do not use any products that contain nicotine or tobacco, such as cigarettes, e-cigarettes, and chewing tobacco. If you need help quitting, ask your health care provider. Do not use drugs. If you are sexually active, practice safe sex. Use a condom or other form of protection to prevent STIs (sexually transmitted infections). Find healthy ways to cope with stress, such as: Meditation, yoga, or listening to music. Journaling. Talking to a trusted person. Spending time with friends and family. Safety Always wear your seat belt while driving or riding in a vehicle. Do not drive: If you have been drinking alcohol. Do not ride with someone who has been drinking. When you are tired or distracted. While texting. Wear a helmet and other protective equipment during sports activities. If you have firearms in your house, make sure you follow all gun safety procedures. Seek help if you have been physically or sexually abused. What's next? Go to your health care provider once a year for an annual wellness visit. Ask your health care provider how often you should have your eyes and teeth checked. Stay up to date on all vaccines. This information is not intended to replace advice given to you by your health care provider. Make sure you discuss any questions you have with your health care provider. Document Revised: 08/05/2020 Document Reviewed: 05/22/2018 Elsevier Patient Education  2022 [employer].

## 2021-04-13 NOTE — Assessment & Plan Note (Signed)
[facility]  We will refill meloxicam today.

## 2021-04-13 NOTE — Assessment & Plan Note (Signed)
Stable on Adderall 30 mg daily.  Minimal side effects.  Medications help with ability to stay focused and on task. Follow up in 3-6 months.

## 2021-04-13 NOTE — Progress Notes (Signed)
Chief Complaint:  Jason Murray is a 35 y.o. male who presents today for his annual comprehensive physical exam.    Assessment/Plan:  Chronic Problems Addressed Today: [facility]  Adult ADHD Stable on Adderall 30 mg daily.  Minimal side effects.  Medications help with ability to stay focused and on task. Follow up in 3-6 months.   Chronic back pain [facility]  We will refill meloxicam today.  Elevated blood pressure reading Slightly above goal today.  Typically well controlled.  Continue home monitoring goal 140/90 or lower.   Body mass index is 26.46 kg/m. / Obese    Preventative Healthcare: UTD on vaccines and screenings. Flu vaccine given today. Would like to get blood work done next time.  Patient Counseling(The following topics were reviewed and/or handout was given):  -Nutrition: Stressed importance of moderation in sodium/caffeine intake, saturated fat and cholesterol, caloric balance, sufficient intake of fresh fruits, vegetables, and fiber.  -Stressed the importance of regular exercise.   -Substance Abuse: Discussed cessation/primary prevention of tobacco, alcohol, or other drug use; driving or other dangerous activities under the influence; availability of treatment for abuse.   -Injury prevention: Discussed safety belts, safety helmets, smoke detector, smoking near bedding or upholstery.   -Sexuality: Discussed sexually transmitted diseases, partner selection, use of condoms, avoidance of unintended pregnancy and contraceptive alternatives.   -Dental health: Discussed importance of regular tooth brushing, flossing, and dental visits.  -Health maintenance and immunizations reviewed. Please refer to Health maintenance section.  Return to care in 1 year for next preventative visit.     Subjective:  HPI:  He has no acute complaints today.   Lifestyle Diet:  Balanced. Plenty of fruits and  vegetables.  Exercise: Busy with work.  Depression screen PHQ 2/9 04/13/2021  Decreased Interest 0  Down, Depressed, Hopeless 0  PHQ - 2 Score 0  Altered sleeping -  Tired, decreased energy -  Change in appetite -  Feeling bad or failure about yourself  -  Trouble concentrating -  Moving slowly or fidgety/restless -  Suicidal thoughts -  PHQ-9 Score -    Health Maintenance Due  Topic Date Due   HIV Screening  Never done   Hepatitis C Screening  Never done   INFLUENZA VACCINE  01/09/2021     ROS: Per HPI, otherwise a complete review of systems was negative.   PMH:  The following were reviewed and entered/updated in epic: Past Medical History:  Diagnosis Date   GERD (gastroesophageal reflux disease)    Patient Active Problem List   Diagnosis Date Noted   Insomnia 04/13/2021   Chronic back pain 08/17/2020   Elevated blood pressure reading 05/30/2020   Adult ADHD 07/24/2018   Past Surgical History:  Procedure Laterality Date   RECTAL EXAM UNDER ANESTHESIA N/A 06/01/2020   Procedure: EXAM UNDER ANESTHESIA, REMOVAL OF FOREIGN BODY RECTUM;  Surgeon: Darnell Level, MD;  Location: [facility];  Service: General;  Laterality: N/A;    Family History  Problem Relation Age of Onset   Diabetes Mother    Arthritis Father    Hearing loss Father    Hypertension Father    Prostate cancer Neg Hx    Colon cancer Neg Hx     Medications- reviewed and updated Current Outpatient Medications  Medication Sig Dispense Refill   amphetamine-dextroamphetamine (ADDERALL) 30 MG tablet Take 1 tablet by mouth daily before breakfast. 30 tablet 0   ibuprofen (ADVIL,MOTRIN)  200 MG tablet Take 600-800 mg by mouth every 6 (six) hours as needed for mild pain.     [START ON 05/19/2021] amphetamine-dextroamphetamine (ADDERALL) 30 MG tablet Take 1 tablet by mouth daily before breakfast. 30 tablet 0   [START ON 04/19/2021] amphetamine-dextroamphetamine (ADDERALL) 30 MG tablet Take 1 tablet by mouth daily  before breakfast. 30 tablet 0   meloxicam (MOBIC) 15 MG tablet Take 1 tablet (15 mg total) by mouth daily. 30 tablet 0   No current facility-administered medications for this visit.    Allergies-reviewed and updated Allergies  Allergen Reactions   Sulfa Antibiotics Swelling    Social History   Socioeconomic History   Marital status: Married    Spouse name: Not on file   Number of children: Not on file   Years of education: Not on file   Highest education level: Not on file  Occupational History   Not on file  Tobacco Use   Smoking status: Former    Types: Cigarettes    Quit date: 06/24/2013    Years since quitting: 7.8   Smokeless tobacco: Never  Vaping Use   Vaping Use: Every day  Substance and Sexual Activity   Alcohol use: Yes   Drug use: Never   Sexual activity: Yes  Other Topics Concern   Not on file  Social History Narrative   Not on file   Social Determinants of Health   Financial Resource Strain: Not on file  Food Insecurity: Not on file  Transportation Needs: Not on file  Physical Activity: Not on file  Stress: Not on file  Social Connections: Not on file        Objective:  Physical Exam: BP (!) 153/94   Pulse 72   Temp 97.6 F (36.4 C) (Temporal)   Ht 6\' 4"  (1.93 m)   Wt 217 lb 6.4 oz (98.6 kg)   SpO2 100%   BMI 26.46 kg/m   Body mass index is 26.46 kg/m. Wt Readings from Last 3 Encounters:  04/13/21 217 lb 6.4 oz (98.6 kg)  03/20/21 218 lb (98.9 kg)  12/06/20 223 lb (101.2 kg)   Gen: NAD, resting comfortably HEENT: TMs normal bilaterally. OP clear. No thyromegaly noted.  CV: RRR with no murmurs appreciated Pulm: NWOB, CTAB with no crackles, wheezes, or rhonchi GI: Normal bowel sounds present. Soft, Nontender, Nondistended. MSK: no edema, cyanosis, or clubbing noted Skin: warm, dry Neuro: CN2-12 grossly intact. Strength 5/5 in upper and lower extremities. Reflexes symmetric and intact bilaterally.  Psych: Normal affect and thought  content      I,Savera Zaman,acting as a scribe for 12/08/20, MD.,have documented all relevant documentation on the behalf of Jacquiline Doe, MD,as directed by  Jacquiline Doe, MD while in the presence of Jacquiline Doe, MD.   I, Jacquiline Doe, MD, have reviewed all documentation for this visit. The documentation on 04/13/21 for the exam, diagnosis, procedures, and orders are all accurate and complete.  13/03/22. [name], MD 04/13/2021 9:00 AM

## 2021-04-13 NOTE — Assessment & Plan Note (Signed)
Slightly above goal today.  Typically well controlled.  Continue home monitoring goal 140/90 or lower.

## 2021-04-13 NOTE — Assessment & Plan Note (Signed)
Following with [facility] hospital psychology.

## 2021-06-01 ENCOUNTER — Telehealth: Payer: Self-pay | Admitting: Family Medicine

## 2021-06-01 MED ORDER — AMPHETAMINE-DEXTROAMPHETAMINE 30 MG PO TABS: 30.0000 mg | ORAL_TABLET | Freq: Every day | ORAL | 0 refills | Status: DC

## 2021-06-01 NOTE — Telephone Encounter (Signed)
Spoke to pt told him [facility]. Pt verbalized understanding.

## 2021-06-01 NOTE — Telephone Encounter (Signed)
Dr. Durene Cal will you please send in Adderall 30 mg for pt he is out and [facility] is out of stock. [facility] I have loaded the cart. Last OV 04/13/2021. Thanks

## 2021-06-01 NOTE — Telephone Encounter (Signed)
pt contact pharmacy were meds were sent and was informed they were out of stock. [facility], and would like to switch his pharmacy going forward as well.   [address], [place], [place] [address].  dextroamphetamine (ADDERALL) 30 MG tablet [[id number]]

## 2021-06-29 ENCOUNTER — Other Ambulatory Visit: Payer: Self-pay | Admitting: Family Medicine

## 2021-06-29 NOTE — Telephone Encounter (Signed)
Pt needs to have his meds refilled and would like to [id number] in [place].  amphetamine-dextroamphetamine (ADDERALL) 30 MG tablet [id number]

## 2021-06-30 MED ORDER — AMPHETAMINE-DEXTROAMPHETAMINE 30 MG PO TABS: 30.0000 mg | ORAL_TABLET | Freq: Every day | ORAL | 0 refills | Status: DC

## 2021-08-12 IMAGING — DX DG THORACIC SPINE 3V
3 series · 3 of 3 positions shown · non-contrast
Comparison: None.

CLINICAL DATA: Back pain.  No reported injury.

EXAM:
THORACIC SPINE - 3 VIEWS

[thoracic spine ap]
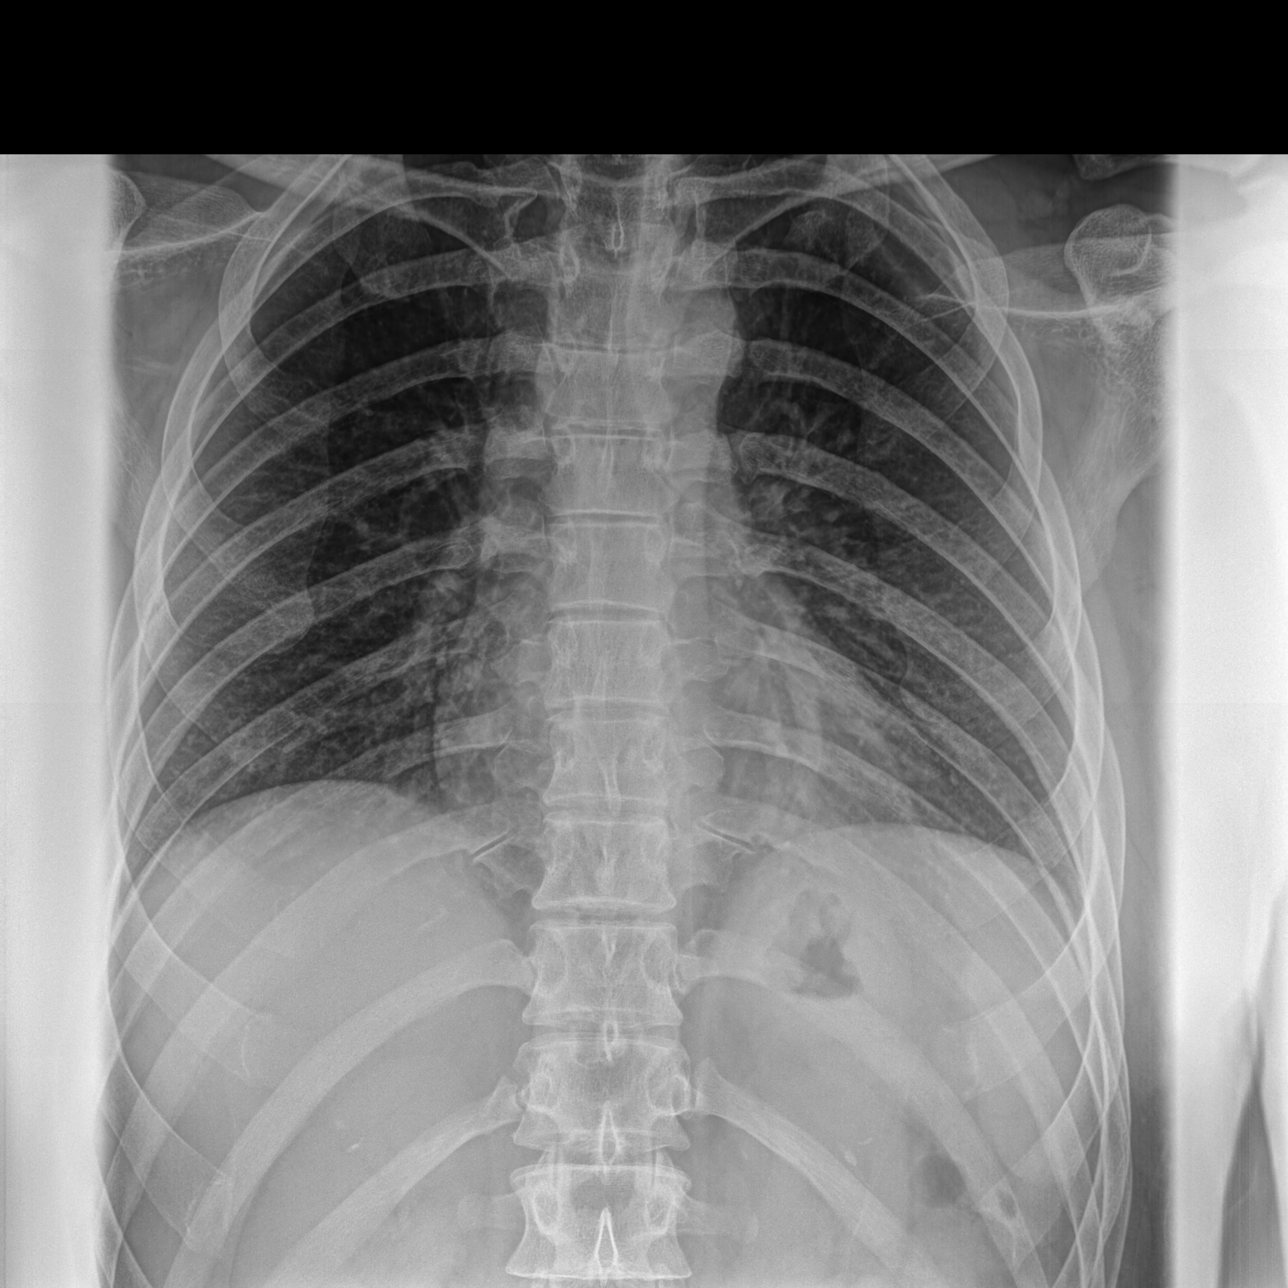

[thoracic spine lat (1 of 2)]
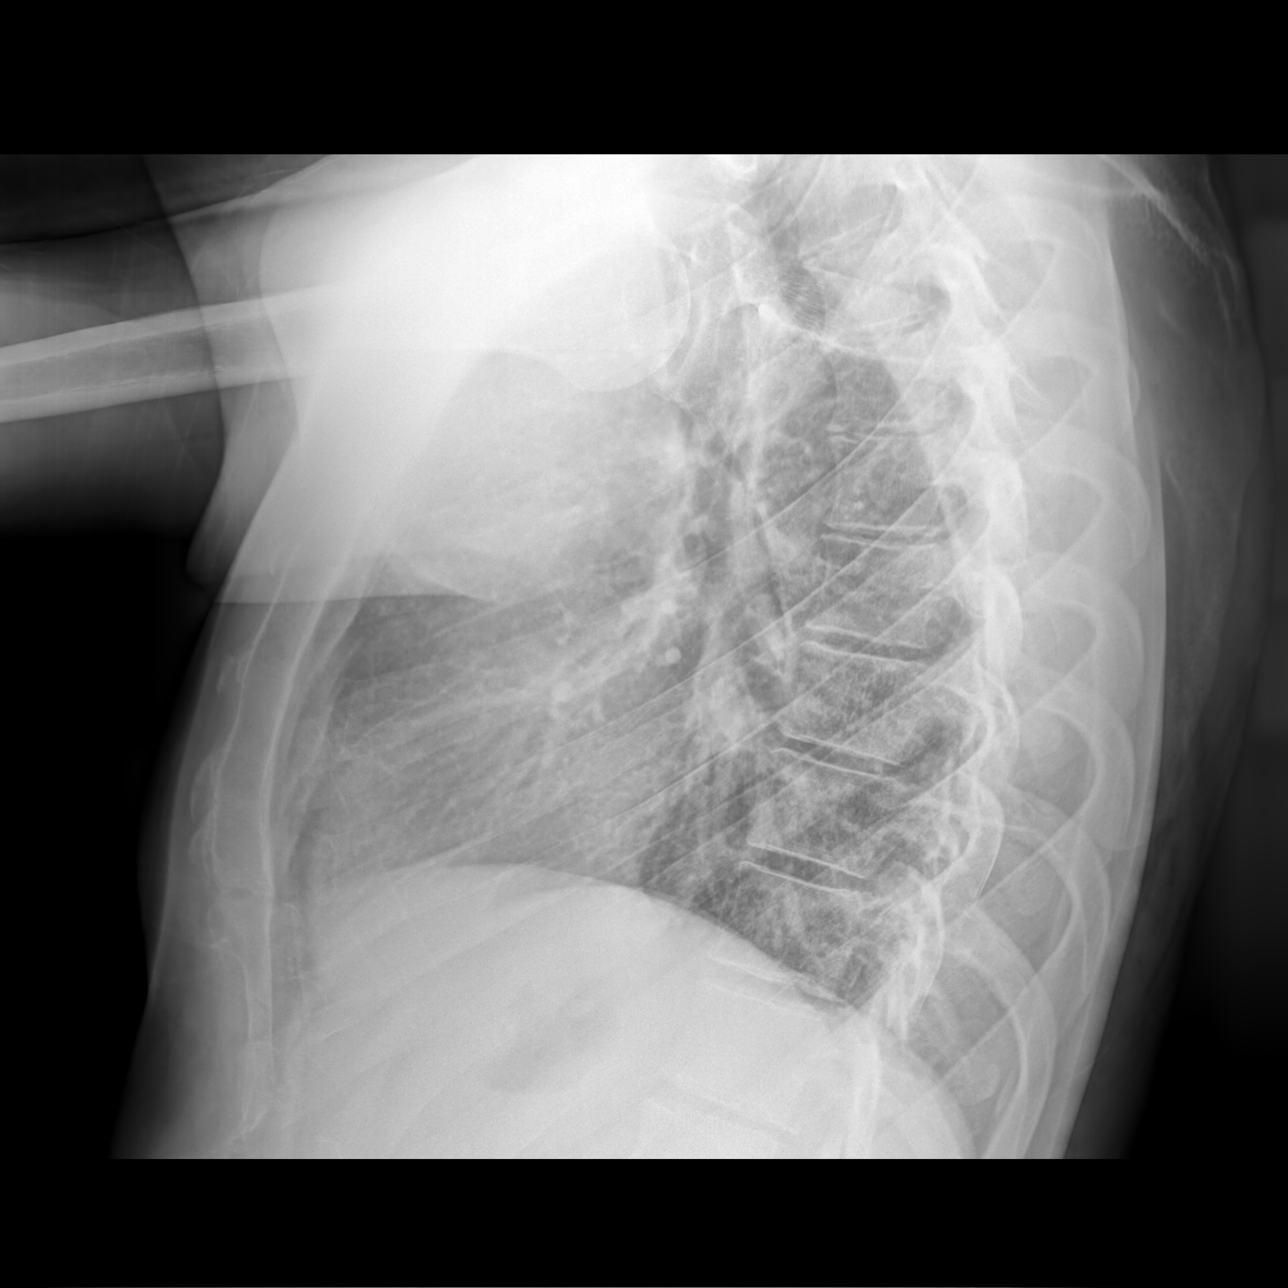

[thoracic spine lat (2 of 2)]
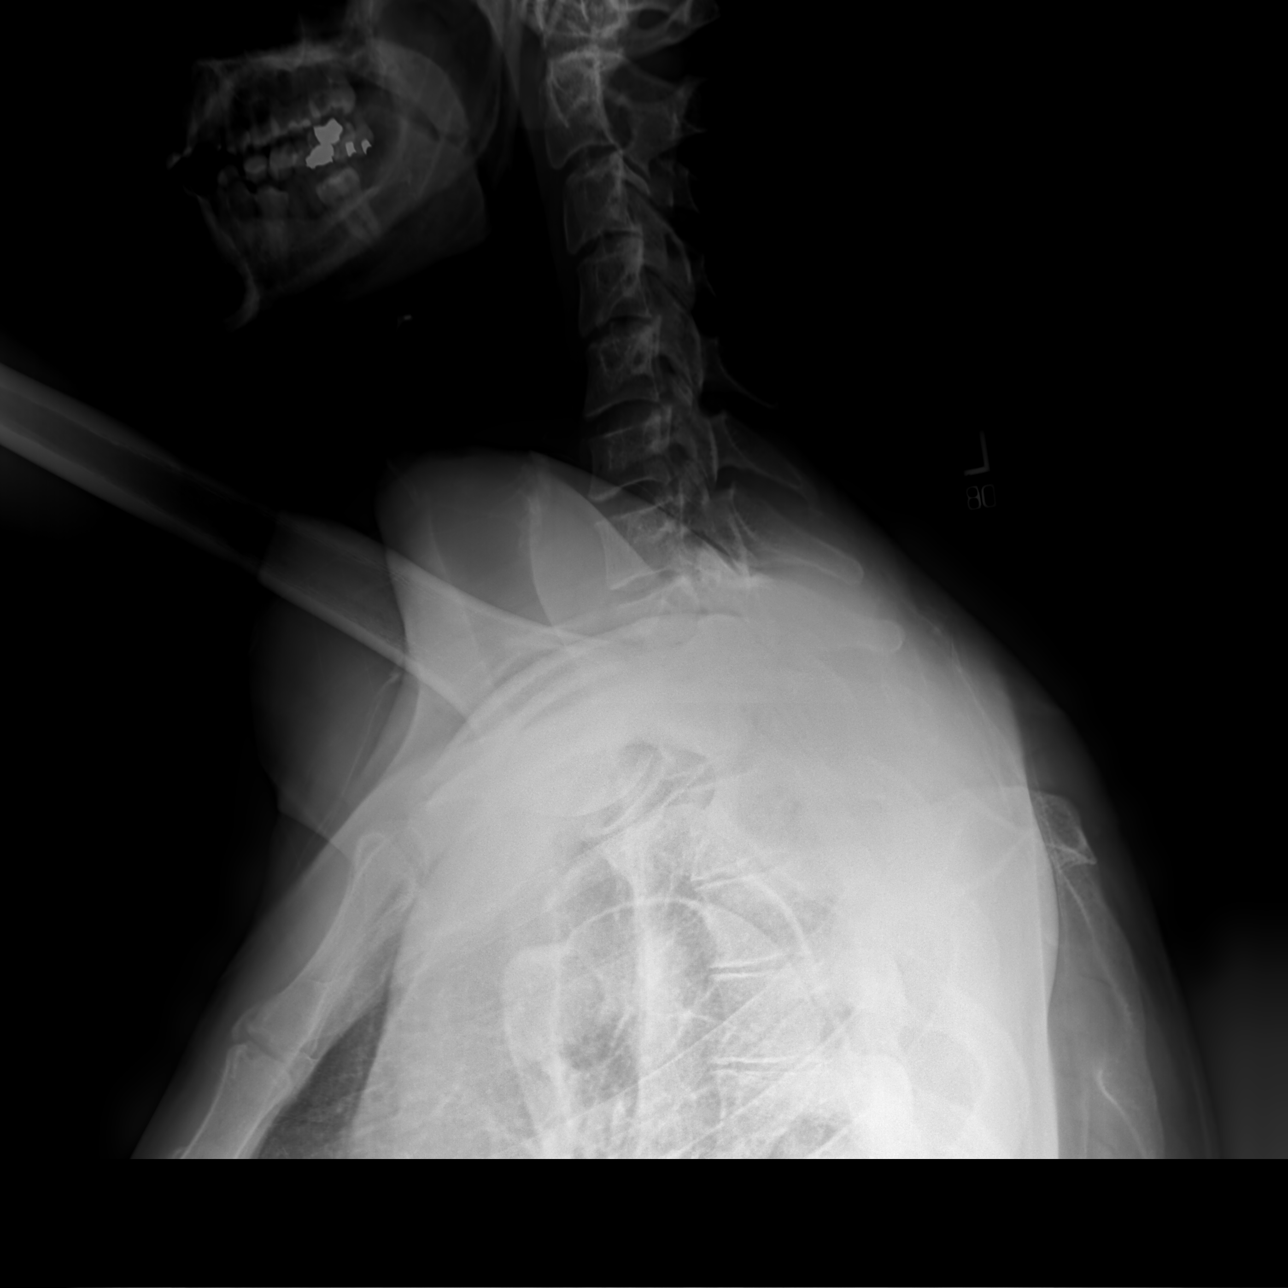

[3 of 3 positions shown; findings below may reference images not displayed]

FINDINGS: Thoracic vertebral body heights are preserved, with no fracture or
subluxation. No focal osseous lesions. No significant degenerative
changes. No radiopaque foreign bodies.
IMPRESSION: No acute osseous abnormality in the thoracic spine. No significant
degenerative changes.

## 2021-08-12 IMAGING — DX DG LUMBAR SPINE 2-3V
3 series · 3 of 3 positions shown · non-contrast
Comparison: None.

CLINICAL DATA: Back pain.  No reported injury.

EXAM:
LUMBAR SPINE - 2-3 VIEW

[lumbar spine ap]
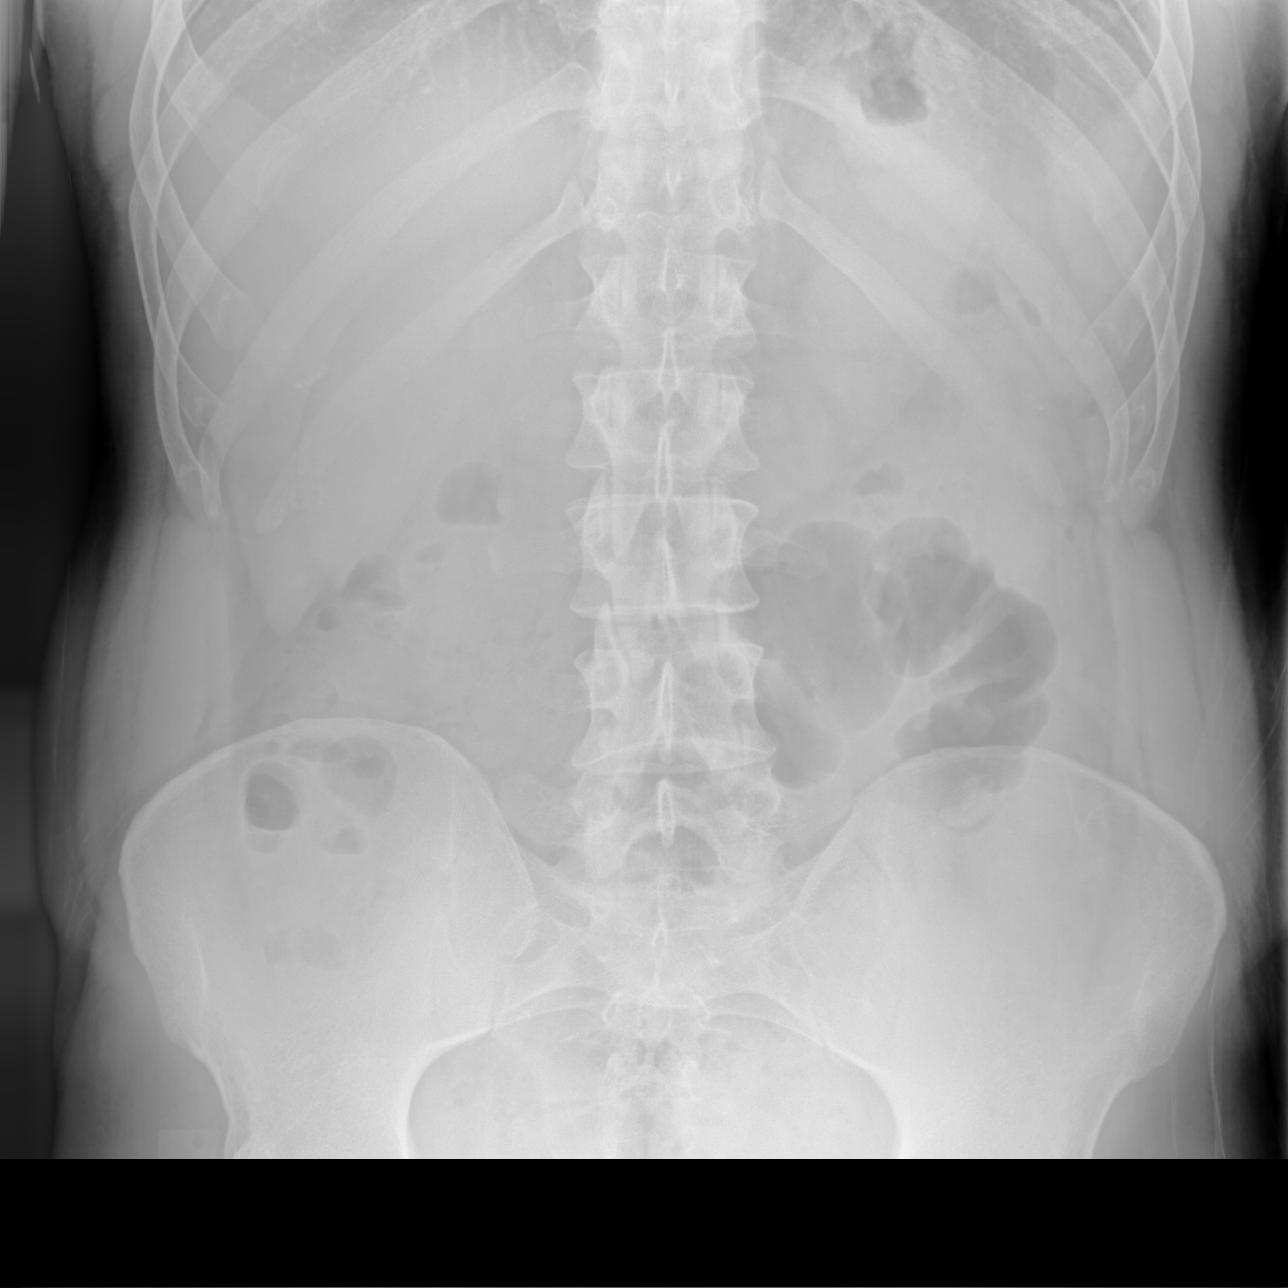

[lumbar spine lat (1 of 2)]
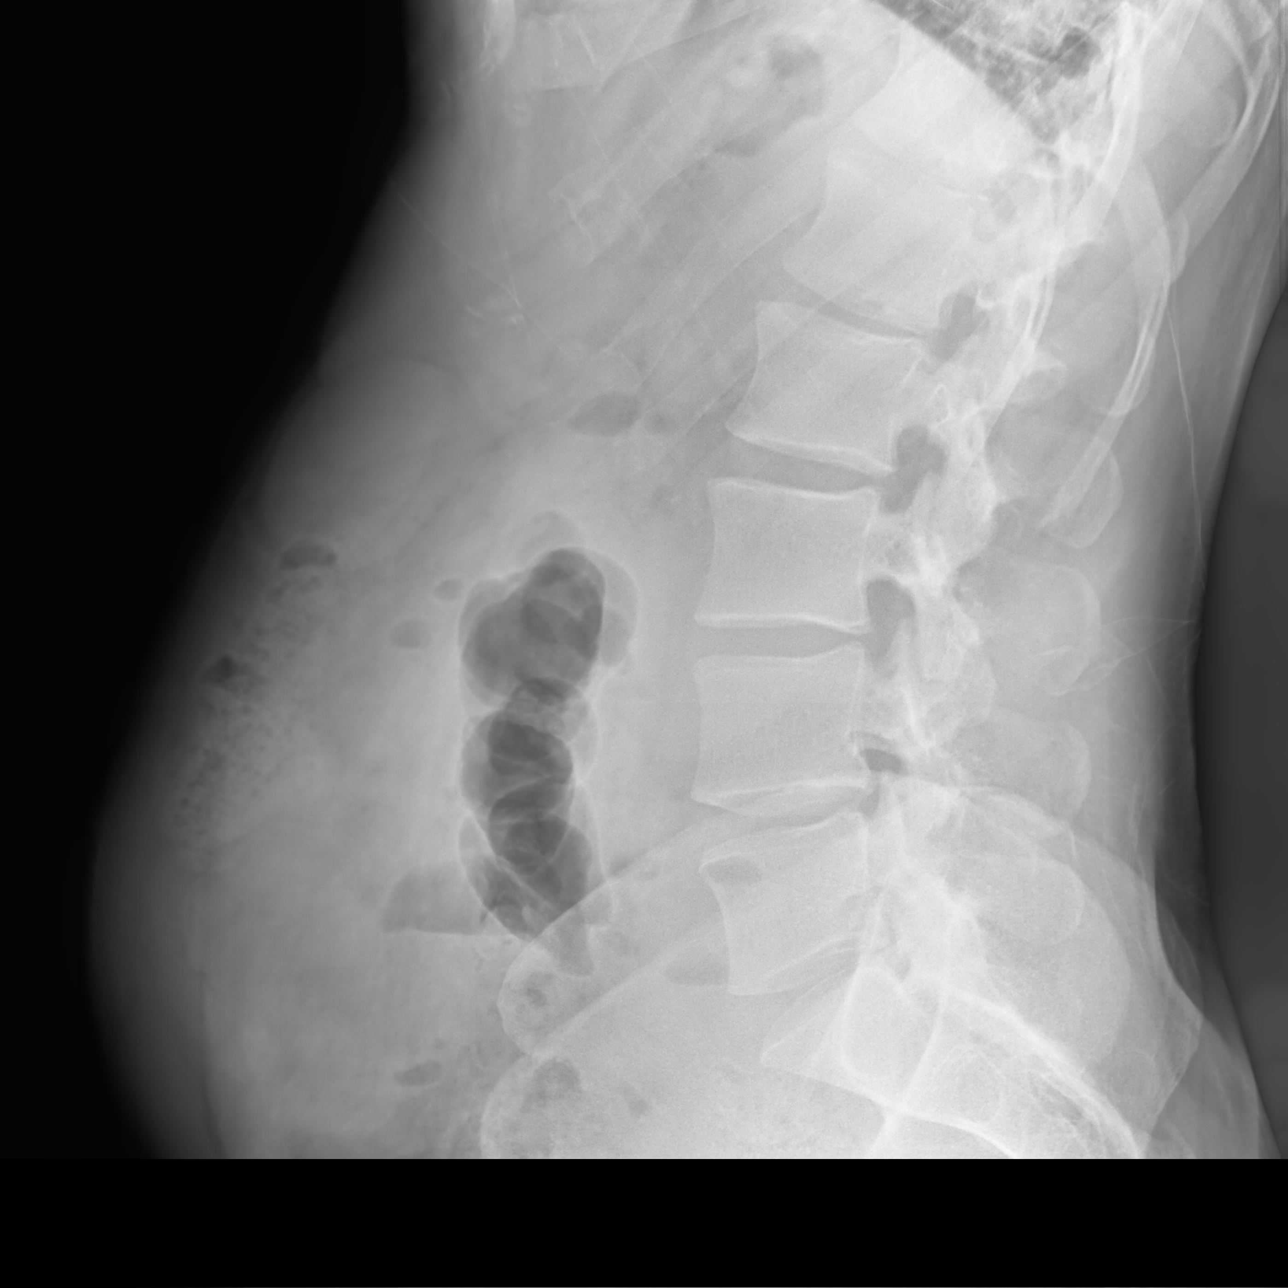

[lumbar spine lat (2 of 2)]
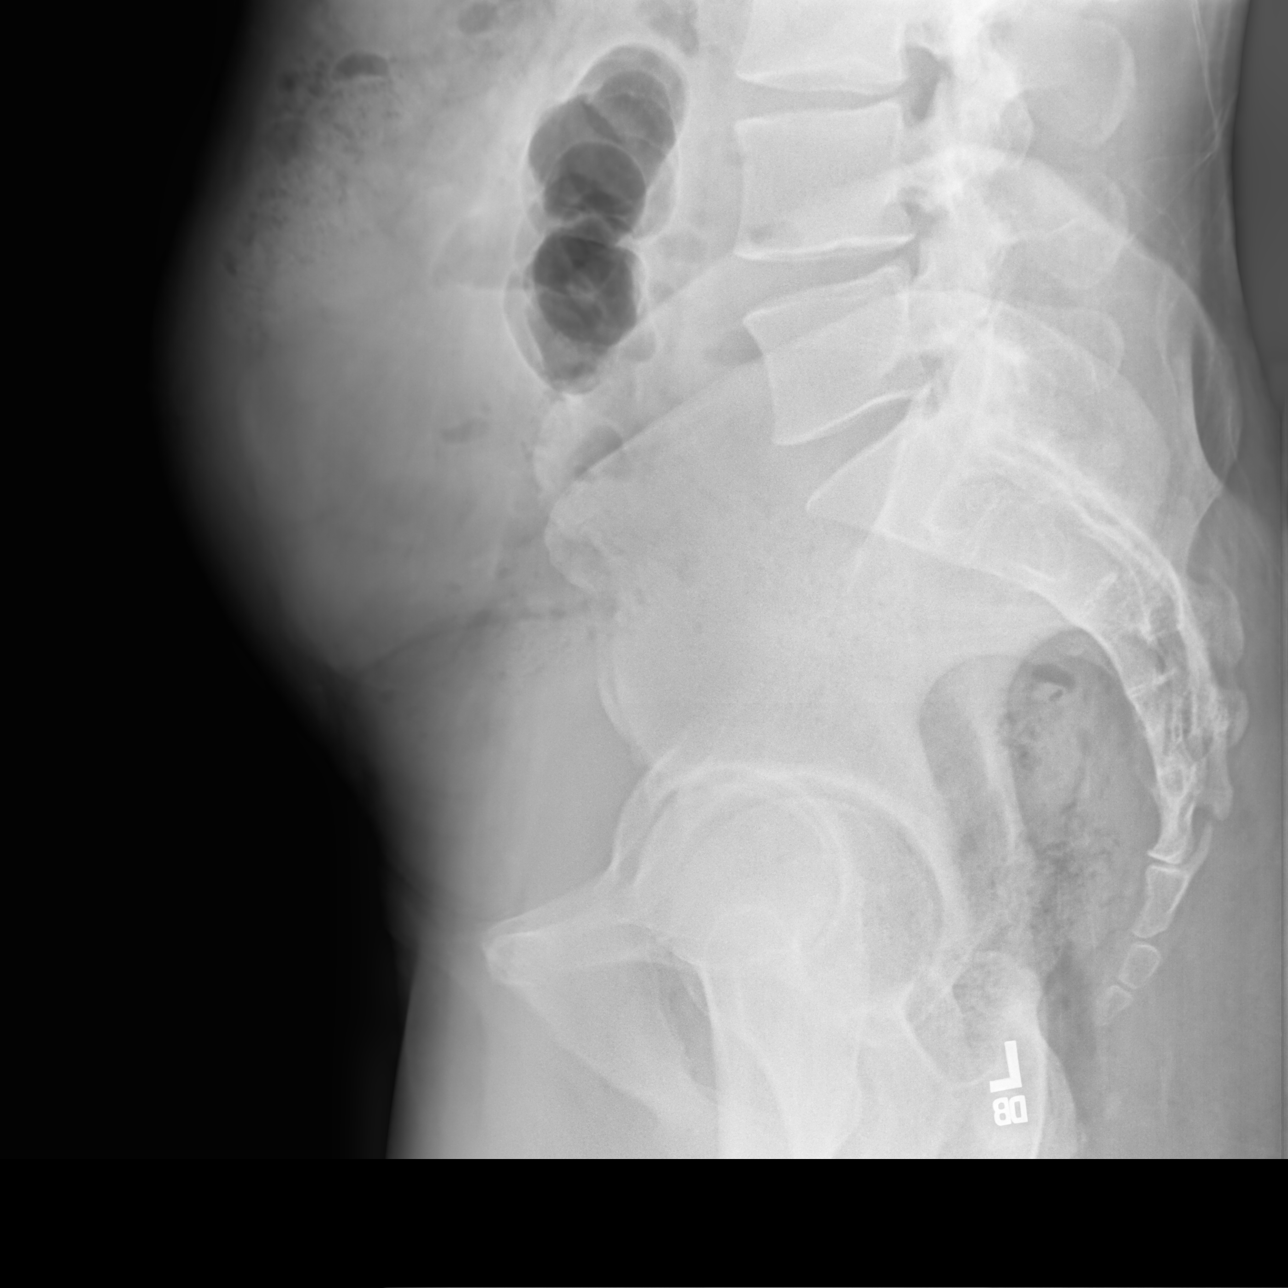

[3 of 3 positions shown; findings below may reference images not displayed]

FINDINGS: This report assumes 5 non rib-bearing lumbar vertebrae.

Lumbar vertebral body heights are preserved, with no fracture.

Mild loss of disc height at L3-4 without significant spondylosis.
Otherwise preserved lumbar discs. No spondylolisthesis. No
appreciable facet arthropathy. No aggressive appearing focal osseous
lesions.
IMPRESSION: Mild loss of disc height at L3-4. Otherwise unremarkable lumbar
spine radiographs.

## 2021-08-28 ENCOUNTER — Other Ambulatory Visit: Payer: Self-pay | Admitting: Family Medicine

## 2021-08-28 MED ORDER — AMPHETAMINE-DEXTROAMPHETAMINE 30 MG PO TABS: 30.0000 mg | ORAL_TABLET | Freq: Every day | ORAL | 0 refills | Status: DC

## 2021-08-28 NOTE — Telephone Encounter (Signed)
Last visit: 04/13/21 ? ?Next visit: n/a ? ?Last filled: 06/30/21 ? ?Quantity: 30 w/ no refills  ?

## 2021-08-28 NOTE — Telephone Encounter (Signed)
.. ?  Encourage patient to contact the pharmacy for refills or they can request refills through [facility] ? ?LAST APPOINTMENT DATE:  04/13/21 ? ?NEXT APPOINTMENT DATE: none on file ? ?MEDICATION:adderall 30 mg  ? ?Is the patient out of medication? Yes  ? ?PHARMACY:[facility] [place]  ? ?Let patient know to contact pharmacy at the end of the day to make sure medication is ready. ? ?Please notify patient to allow 48-72 hours to process  ?

## 2021-09-27 ENCOUNTER — Other Ambulatory Visit: Payer: Self-pay | Admitting: Family Medicine

## 2021-09-27 NOTE — Telephone Encounter (Signed)
Patient wants a refill for adderall - patient has not seen dr Jerline Pain since 04/2021- does patient need a fu apt?  ?

## 2021-09-27 NOTE — Telephone Encounter (Signed)
Pt called in stating he is leaving town tomorrow and needed his refill today. I informed him refills can take 48-72 hrs. He stated that has never been the case before and hung up ?

## 2021-09-28 MED ORDER — AMPHETAMINE-DEXTROAMPHETAMINE 30 MG PO TABS: 30.0000 mg | ORAL_TABLET | Freq: Every day | ORAL | 0 refills | Status: DC

## 2021-09-28 NOTE — Telephone Encounter (Signed)
Send request to Dr Jerline Pain ? ?

## 2021-10-23 ENCOUNTER — Ambulatory Visit (INDEPENDENT_AMBULATORY_CARE_PROVIDER_SITE_OTHER): Payer: 59 | Admitting: Family Medicine

## 2021-10-23 ENCOUNTER — Encounter: Payer: Self-pay | Admitting: Family Medicine

## 2021-10-23 VITALS — BP 158/100 | HR 96 | Temp 97.3°F | Ht 76.0 in | Wt 218.6 lb

## 2021-10-23 DIAGNOSIS — M549 Dorsalgia, unspecified: Secondary | ICD-10-CM

## 2021-10-23 DIAGNOSIS — R040 Epistaxis: Secondary | ICD-10-CM

## 2021-10-23 DIAGNOSIS — G8929 Other chronic pain: Secondary | ICD-10-CM | POA: Diagnosis not present

## 2021-10-23 DIAGNOSIS — F909 Attention-deficit hyperactivity disorder, unspecified type: Secondary | ICD-10-CM

## 2021-10-23 MED ORDER — AMPHETAMINE-DEXTROAMPHETAMINE 30 MG PO TABS: 30.0000 mg | ORAL_TABLET | Freq: Every day | ORAL | 0 refills | Status: DC

## 2021-10-23 MED ORDER — AMPHETAMINE-DEXTROAMPHETAMINE 30 MG PO TABS
30.0000 mg | ORAL_TABLET | Freq: Every day | ORAL | 0 refills | Status: DC
Start: 2021-11-22 — End: 2022-03-08

## 2021-10-23 MED ORDER — MELOXICAM 15 MG PO TABS
15.0000 mg | ORAL_TABLET | Freq: Every day | ORAL | 0 refills | Status: DC
Start: 2021-10-23 — End: 2022-08-20

## 2021-10-23 NOTE — Patient Instructions (Signed)
It was very nice to see you today! ? ?We will refer you to ENT to see if they can help with the nosebleeds.  Please buy a bottle of Afrin to have on hand in case you have a recurrence of a profuse nosebleed that does not stop with gentle pressure. ? ?I will refill your Adderall.  Please come back to see me in 3 to 6 months.  Come back sooner if needed. ? ?Take care, ?Dr Jerline Pain ? ?PLEASE NOTE: ? ?If you had any lab tests please let us know if you have not heard back within a few days. You may see your results on mychart before we have a chance to review them but we will give you a call once they are reviewed by [place]. If we ordered any referrals today, please let us know if you have not heard from their office within the next week.  ? ?Please try these tips to maintain a healthy lifestyle: ? ?Eat at least 3 REAL meals and 1-2 snacks per day.  Aim for no more than 5 hours between eating.  If you eat breakfast, please do so within one hour of getting up.  ? ?Each meal should contain half fruits/vegetables, one quarter protein, and one quarter carbs (no bigger than a computer mouse) ? ?Cut down on sweet beverages. This includes juice, soda, and sweet tea.  ? ?Drink at least 1 glass of water with each meal and aim for at least 8 glasses per day ? ?Exercise at least 150 minutes every week.   ?

## 2021-10-23 NOTE — Assessment & Plan Note (Signed)
Continue meloxicam 15 mg daily as needed. ?
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
## 2021-10-23 NOTE — Assessment & Plan Note (Signed)
Database without red flags.  Medications help ability stay focused and on task.  Will refill Adderall 30 mg daily today.  Follow-up in 3 to 6 months. ?

## 2021-10-23 NOTE — Assessment & Plan Note (Signed)
No areas obviously amenable to cauterization in office today.  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  We will refer to ENT for further evaluation and management.  Advised patient he could use Afrin as needed for severe nosebleeds. ?

## 2022-01-24 ENCOUNTER — Other Ambulatory Visit: Payer: Self-pay | Admitting: Family Medicine

## 2022-01-24 NOTE — Telephone Encounter (Signed)
LAST APPOINTMENT DATE:  10/23/21  NEXT APPOINTMENT DATE: None  MEDICATION: amphetamine-dextroamphetamine (ADDERALL) 30 MG tablet  Is the patient out of medication? Yes  PHARMACY: [facility] [phone] - [place], [place] - [address] AT [facility] [address]  [address], [place] [address]  Phone:  [phone]  Fax:  [phone]  DEA #:  [id number]

## 2022-01-26 MED ORDER — AMPHETAMINE-DEXTROAMPHETAMINE 30 MG PO TABS: 30.0000 mg | ORAL_TABLET | Freq: Every day | ORAL | 0 refills | Status: DC

## 2022-02-23 ENCOUNTER — Telehealth: Payer: Self-pay | Admitting: Family Medicine

## 2022-02-23 NOTE — Telephone Encounter (Signed)
Patient requests Refills with the RX listed below. Patient states the Pharmacy won't allow Patient to request refills in advance of Patient running out of medication every month.  LAST APPOINTMENT DATE:  Please schedule appointment if longer than 1 year 10/23/21  NEXT APPOINTMENT DATE:  MEDICATION: amphetamine-dextroamphetamine (ADDERALL) 30 MG tablet  Is the patient out of medication? Yes  PHARMACY:  [facility] [phone] - [place], [place] - [address] Phone:  [phone]  Fax:  [phone]     Let patient know to contact pharmacy at the end of the day to make sure medication is ready.  Please notify patient to allow 48-72 hours to process

## 2022-02-28 NOTE — Telephone Encounter (Signed)
Rx was sent to pharmacy 02/25/22

## 2022-03-05 ENCOUNTER — Encounter: Payer: Self-pay | Admitting: *Deleted

## 2022-03-08 ENCOUNTER — Encounter: Payer: Self-pay | Admitting: Family Medicine

## 2022-03-08 ENCOUNTER — Telehealth (INDEPENDENT_AMBULATORY_CARE_PROVIDER_SITE_OTHER): Payer: 59 | Admitting: Family Medicine

## 2022-03-08 VITALS — Ht 76.0 in | Wt 224.0 lb

## 2022-03-08 DIAGNOSIS — M549 Dorsalgia, unspecified: Secondary | ICD-10-CM

## 2022-03-08 DIAGNOSIS — R040 Epistaxis: Secondary | ICD-10-CM | POA: Diagnosis not present

## 2022-03-08 DIAGNOSIS — G8929 Other chronic pain: Secondary | ICD-10-CM

## 2022-03-08 DIAGNOSIS — F909 Attention-deficit hyperactivity disorder, unspecified type: Secondary | ICD-10-CM | POA: Diagnosis not present

## 2022-03-08 MED ORDER — AMPHETAMINE-DEXTROAMPHETAMINE 30 MG PO TABS: 30.0000 mg | ORAL_TABLET | Freq: Every day | ORAL | 0 refills | Status: DC

## 2022-03-08 NOTE — Assessment & Plan Note (Signed)
Database without red flags.  Medications help little stay focused and on task.  Will refill Adderall 30 mg daily.  Follow-up in 6 months for CPE.

## 2022-03-08 NOTE — Assessment & Plan Note (Signed)
Resolved after using Afrin.  He is also using Afrin has not had any recurrences for last couple of months.

## 2022-03-08 NOTE — Assessment & Plan Note (Signed)
Stable on meloxicam as needed.  Does not need refill today.

## 2022-03-08 NOTE — Progress Notes (Signed)
   Jason Murray is a 36 y.o. male who presents today for a virtual office visit.  Assessment/Plan:  Chronic Problems Addressed Today: Adult ADHD Database without red flags.  Medications help little stay focused and on task.  Will refill Adderall 30 mg daily.  Follow-up in 6 months for CPE.  Epistaxis Resolved after using Afrin.  He is also using Afrin has not had any recurrences for last couple of months.   Chronic back pain Stable on meloxicam as needed.  Does not need refill today.     Subjective:  HPI:  See A/p for status of chronic conditions.         Objective/Observations  Physical Exam: Gen: NAD, resting comfortably Pulm: Normal work of breathing Neuro: Grossly normal, moves all extremities Psych: Normal affect and thought content  Virtual Visit via Video   I connected with Jason Murray on 03/08/22 at  9:20 AM EDT by a video enabled telemedicine application and verified that I am speaking with the correct person using two identifiers. The limitations of evaluation and management by telemedicine and the availability of in person appointments were discussed. The patient expressed understanding and agreed to proceed.   Patient location: Home Provider location: [place] participating in the virtual visit: Myself and Patient     Algis Greenhouse. Jerline Pain, MD 03/08/2022 9:39 AM

## 2022-03-12 ENCOUNTER — Telehealth: Payer: Self-pay | Admitting: Family Medicine

## 2022-03-12 NOTE — Telephone Encounter (Signed)
Patient states he received MyChart RX for Adderall was sent to Pharmacy on 03/08/22, however, Pharmacy told Patient that they cannot fill until 04/07/22 (Patient states it shows 1 RX was sent for 10/23/232 and 2 RX's were sent to Pharmacy for 05/07/22).  Patient states he is out of the above medication and requests RX for  amphetamine-dextroamphetamine (ADDERALL) 30 MG tablet  Be sent asap to:  [facility] [place], [place] - [address] Phone:  [phone]  Fax:  [phone]      Patient requests to be called at ph# [phone] to discuss/resolve message

## 2022-03-13 MED ORDER — AMPHETAMINE-DEXTROAMPHETAMINE 30 MG PO TABS: 30.0000 mg | ORAL_TABLET | Freq: Every day | ORAL | 0 refills | Status: DC

## 2022-03-14 ENCOUNTER — Telehealth: Payer: Self-pay | Admitting: *Deleted

## 2022-03-14 NOTE — Telephone Encounter (Signed)
Patient wife call stated patient been out of medication  Unable to pick up prescription due to note on Rx Patient unable to pick up Rx till 04/07/2022, he is out of medication. 

## 2022-03-14 NOTE — Telephone Encounter (Signed)
Left voice message to wife  Rx send to [place]

## 2022-03-14 NOTE — Telephone Encounter (Signed)
Patient wife call stated patient been out of medication  Unable to pick up prescription due to note on Rx Patient unable to pick up Rx till 04/07/2022, he is out of medication.

## 2022-03-15 MED ORDER — AMPHETAMINE-DEXTROAMPHETAMINE 30 MG PO TABS: 30.0000 mg | ORAL_TABLET | Freq: Every day | ORAL | 0 refills | Status: DC

## 2022-03-15 NOTE — Telephone Encounter (Signed)
Spoke with patient stated pharmacy send him a massage insurance needed  Unsure if wife pick up prescription  Call patient wife LVM Rx was send in this morning

## 2022-03-15 NOTE — Telephone Encounter (Signed)
Patient wife returned call. Stated medication need PA  Pharmacy will send PA form

## 2022-03-15 NOTE — Telephone Encounter (Signed)
Pt's wife states medication is needing a PA. She is asking to have it done asap. I advised pt it would be processed as soon as we receive it.

## 2022-03-15 NOTE — Addendum Note (Signed)
Addended by: Vivi Barrack on: 03/15/2022 07:21 AM   Modules accepted: Orders

## 2022-03-16 ENCOUNTER — Telehealth: Payer: 59 | Admitting: Family Medicine

## 2022-03-16 ENCOUNTER — Telehealth: Payer: Self-pay | Admitting: *Deleted

## 2022-03-16 NOTE — Telephone Encounter (Signed)
Called [phone] to start PA Rx Adderall  Spoke with Zori advised to go to  [email or web address] To start PA  PA Adderall send today  Waiting for determination

## 2022-03-16 NOTE — Telephone Encounter (Signed)
Patient's wife Anderson Malta requests to be called at ph# [phone]  for status of PA request from Pharmacy

## 2022-03-19 NOTE — Telephone Encounter (Signed)
Called [phone] insurance company, was placed on hold multiple time, on the phone for about 51min  Stated PA was send to [facility] for review. Was told to call back tomorrow for determination  OA#[id number]

## 2022-03-19 NOTE — Telephone Encounter (Signed)
Called Patient wife  ph# [phone]  with PA information

## 2022-03-19 NOTE — Telephone Encounter (Signed)
Spoke with patient wife, stated still did not got a determination for medication

## 2022-03-20 NOTE — Telephone Encounter (Signed)
See previews note 

## 2022-03-21 NOTE — Telephone Encounter (Signed)
[facility] form recived today Form done and faxed to [phone] Waiting for determination

## 2022-03-29 NOTE — Telephone Encounter (Signed)
Patient's wife requests to be called at ph# [phone]  re: Status of PA for Adderall

## 2022-03-29 NOTE — Telephone Encounter (Signed)
[facility] [facility] #[id number]  Approved from 02/17/2022 till 03/29/2023 Patient notified

## 2022-04-27 ENCOUNTER — Ambulatory Visit (INDEPENDENT_AMBULATORY_CARE_PROVIDER_SITE_OTHER): Payer: 59 | Admitting: Family Medicine

## 2022-04-27 ENCOUNTER — Encounter: Payer: Self-pay | Admitting: Family Medicine

## 2022-04-27 VITALS — BP 140/92 | HR 77 | Temp 98.2°F | Ht 76.0 in | Wt 221.6 lb

## 2022-04-27 DIAGNOSIS — F909 Attention-deficit hyperactivity disorder, unspecified type: Secondary | ICD-10-CM

## 2022-04-27 DIAGNOSIS — Z23 Encounter for immunization: Secondary | ICD-10-CM | POA: Diagnosis not present

## 2022-04-27 DIAGNOSIS — Z13220 Encounter for screening for lipoid disorders: Secondary | ICD-10-CM | POA: Diagnosis not present

## 2022-04-27 DIAGNOSIS — M549 Dorsalgia, unspecified: Secondary | ICD-10-CM

## 2022-04-27 DIAGNOSIS — Z131 Encounter for screening for diabetes mellitus: Secondary | ICD-10-CM | POA: Diagnosis not present

## 2022-04-27 DIAGNOSIS — Z0001 Encounter for general adult medical examination with abnormal findings: Secondary | ICD-10-CM | POA: Diagnosis not present

## 2022-04-27 DIAGNOSIS — G8929 Other chronic pain: Secondary | ICD-10-CM

## 2022-04-27 DIAGNOSIS — R030 Elevated blood-pressure reading, without diagnosis of hypertension: Secondary | ICD-10-CM

## 2022-04-27 LAB — COMPREHENSIVE METABOLIC PANEL
ALT: 33 U/L (ref 0–53)
AST: 23 U/L (ref 0–37)
Albumin: 4.5 g/dL (ref 3.5–5.2)
Alkaline Phosphatase: 68 U/L (ref 39–117)
BUN: 12 mg/dL (ref 6–23)
CO2: 26 mEq/L (ref 19–32)
Calcium: 9.4 mg/dL (ref 8.4–10.5)
Chloride: 104 mEq/L (ref 96–112)
Creatinine, Ser: 0.91 mg/dL (ref 0.40–1.50)
GFR: 108.76 mL/min (ref >60.00)
Glucose, Bld: 87 mg/dL (ref 70–99)
Potassium: 4.3 mEq/L (ref 3.5–5.1)
Sodium: 137 mEq/L (ref 135–145)
Total Bilirubin: 0.4 mg/dL (ref 0.2–1.2)
Total Protein: 7.1 g/dL (ref 6.0–8.3)

## 2022-04-27 LAB — LIPID PANEL
Cholesterol: 209 mg/dL — ABNORMAL HIGH (ref 0–200)
HDL: 60.1 mg/dL (ref >39.00)
LDL Cholesterol: 125 mg/dL — ABNORMAL HIGH (ref 0–99)
NonHDL: 148.92
Total CHOL/HDL Ratio: 3
Triglycerides: 119 mg/dL (ref 0.0–149.0)
VLDL: 23.8 mg/dL (ref 0.0–40.0)

## 2022-04-27 LAB — CBC
HCT: 47 % (ref 39.0–52.0)
Hemoglobin: 16 g/dL (ref 13.0–17.0)
MCHC: 34.1 g/dL (ref 30.0–36.0)
MCV: 87.7 fl (ref 78.0–100.0)
Platelets: 212 10*3/uL (ref 150.0–400.0)
RBC: 5.37 Mil/uL (ref 4.22–5.81)
RDW: 12.9 % (ref 11.5–15.5)
WBC: 5.3 10*3/uL (ref 4.0–10.5)

## 2022-04-27 LAB — TSH: TSH: 1.57 u[IU]/mL (ref 0.35–5.50)

## 2022-04-27 LAB — HEMOGLOBIN A1C: Hgb A1c MFr Bld: 5.3 % (ref 4.6–6.5)

## 2022-04-27 NOTE — Assessment & Plan Note (Signed)
No recent flares.  Uses meloxicam as needed.  Does not need refill today.

## 2022-04-27 NOTE — Assessment & Plan Note (Signed)
Slightly above goal today at 140/92.  He has been elevated last few times here but may have some element of whitecoat hypertension.  Recommended patient check at home for the neck several weeks and check labs via MyChart.  If persistently elevated will need to start antihypertensive such as amlodipine.

## 2022-04-27 NOTE — Assessment & Plan Note (Signed)
Database with no red flags.  Medications help with ability to stay focused and on task.  Doing well on Adderall 30 mg daily without any significant side effects.  Follow-up in 6 months.

## 2022-04-27 NOTE — Patient Instructions (Signed)
It was very nice to see you today!  We will check blood work today.  We will give your tetanus shot today.  Please keep an eye on your blood pressure at home and let me know if it is persistently 140/90 or higher.  We will see back in 6 months for medication recheck.  Come back sooner if needed.  Take care, Dr Jimmey Ralph  PLEASE NOTE:  If you had any lab tests please let us know if you have not heard back within a few days. You may see your results on mychart before we have a chance to review them but we will give you a call once they are reviewed by [place]. If we ordered any referrals today, please let us know if you have not heard from their office within the next week.   Please try these tips to maintain a healthy lifestyle:  Eat at least 3 REAL meals and 1-2 snacks per day.  Aim for no more than 5 hours between eating.  If you eat breakfast, please do so within one hour of getting up.   Each meal should contain half fruits/vegetables, one quarter protein, and one quarter carbs (no bigger than a computer mouse)  Cut down on sweet beverages. This includes juice, soda, and sweet tea.   Drink at least 1 glass of water with each meal and aim for at least 8 glasses per day  Exercise at least 150 minutes every week.    Preventive Care 56-60 Years Old, Male Preventive care refers to lifestyle choices and visits with your health care provider that can promote health and wellness. Preventive care visits are also called wellness exams. What can I expect for my preventive care visit? Counseling During your preventive care visit, your health care provider may ask about your: Medical history, including: Past medical problems. Family medical history. Current health, including: Emotional well-being. Home life and relationship well-being. Sexual activity. Lifestyle, including: Alcohol, nicotine or tobacco, and drug use. Access to firearms. Diet, exercise, and sleep habits. Safety issues such  as seatbelt and bike helmet use. Sunscreen use. Work and work Astronomer. Physical exam Your health care provider may check your: Height and weight. These may be used to calculate your BMI (body mass index). BMI is a measurement that tells if you are at a healthy weight. Waist circumference. This measures the distance around your waistline. This measurement also tells if you are at a healthy weight and may help predict your risk of certain diseases, such as type 2 diabetes and high blood pressure. Heart rate and blood pressure. Body temperature. Skin for abnormal spots. What immunizations do I need?  Vaccines are usually given at various ages, according to a schedule. Your health care provider will recommend vaccines for you based on your age, medical history, and lifestyle or other factors, such as travel or where you work. What tests do I need? Screening Your health care provider may recommend screening tests for certain conditions. This may include: Lipid and cholesterol levels. Diabetes screening. This is done by checking your blood sugar (glucose) after you have not eaten for a while (fasting). Hepatitis B test. Hepatitis C test. HIV (human immunodeficiency virus) test. STI (sexually transmitted infection) testing, if you are at risk. Talk with your health care provider about your test results, treatment options, and if necessary, the need for more tests. Follow these instructions at home: Eating and drinking  Eat a healthy diet that includes fresh fruits and vegetables, whole grains, lean  protein, and low-fat dairy products. Drink enough fluid to keep your urine pale yellow. Take vitamin and mineral supplements as recommended by your health care provider. Do not drink alcohol if your health care provider tells you not to drink. If you drink alcohol: Limit how much you have to 0-2 drinks a day. Know how much alcohol is in your drink. In the [place] one drink equals one 12 oz  bottle of beer (355 mL), one 5 oz glass of wine (148 mL), or one 1 oz glass of hard liquor (44 mL). Lifestyle Brush your teeth every morning and night with fluoride toothpaste. Floss one time each day. Exercise for at least 30 minutes 5 or more days each week. Do not use any products that contain nicotine or tobacco. These products include cigarettes, chewing tobacco, and vaping devices, such as e-cigarettes. If you need help quitting, ask your health care provider. Do not use drugs. If you are sexually active, practice safe sex. Use a condom or other form of protection to prevent STIs. Find healthy ways to manage stress, such as: Meditation, yoga, or listening to music. Journaling. Talking to a trusted person. Spending time with friends and family. Minimize exposure to UV radiation to reduce your risk of skin cancer. Safety Always wear your seat belt while driving or riding in a vehicle. Do not drive: If you have been drinking alcohol. Do not ride with someone who has been drinking. If you have been using any mind-altering substances or drugs. While texting. When you are tired or distracted. Wear a helmet and other protective equipment during sports activities. If you have firearms in your house, make sure you follow all gun safety procedures. Seek help if you have been physically or sexually abused. What's next? Go to your health care provider once a year for an annual wellness visit. Ask your health care provider how often you should have your eyes and teeth checked. Stay up to date on all vaccines. This information is not intended to replace advice given to you by your health care provider. Make sure you discuss any questions you have with your health care provider. Document Revised: 11/23/2020 Document Reviewed: 11/23/2020 Elsevier Patient Education  2023 [employer].

## 2022-04-27 NOTE — Progress Notes (Signed)
Chief Complaint:  Jason Murray is a 36 y.o. male who presents today for his annual comprehensive physical exam.    Assessment/Plan:  Chronic Problems Addressed Today: Adult ADHD Database with no red flags.  Medications help with ability to stay focused and on task.  Doing well on Adderall 30 mg daily without any significant side effects.  Follow-up in 6 months.  Chronic back pain No recent flares.  Uses meloxicam as needed.  Does not need refill today.   Preventative Healthcare: Tdap given today.  Check labs  Patient Counseling(The following topics were reviewed and/or handout was given):  -Nutrition: Stressed importance of moderation in sodium/caffeine intake, saturated fat and cholesterol, caloric balance, sufficient intake of fresh fruits, vegetables, and fiber.  -Stressed the importance of regular exercise.   -Substance Abuse: Discussed cessation/primary prevention of tobacco, alcohol, or other drug use; driving or other dangerous activities under the influence; availability of treatment for abuse.   -Injury prevention: Discussed safety belts, safety helmets, smoke detector, smoking near bedding or upholstery.   -Sexuality: Discussed sexually transmitted diseases, partner selection, use of condoms, avoidance of unintended pregnancy and contraceptive alternatives.   -Dental health: Discussed importance of regular tooth brushing, flossing, and dental visits.  -Health maintenance and immunizations reviewed. Please refer to Health maintenance section.  Return to care in 1 year for next preventative visit.     Subjective:  HPI:  He has no acute complaints today.   Lifestyle Diet: Balanced. Plenty of fruits and vegetables.  Exercise: Busy with work. Gets a lot of steps in.      04/27/2022    8:08 AM  Depression screen PHQ 2/9  Decreased Interest 0  Down, Depressed, Hopeless 0  PHQ - 2 Score 0   ROS: Per HPI, otherwise a complete review of systems was negative.    PMH:  The following were reviewed and entered/updated in epic: Past Medical History:  Diagnosis Date   GERD (gastroesophageal reflux disease)    Patient Active Problem List   Diagnosis Date Noted   Epistaxis 10/23/2021   Insomnia 04/13/2021   Chronic back pain 08/17/2020   Elevated blood pressure reading 05/30/2020   Adult ADHD 07/24/2018   Past Surgical History:  Procedure Laterality Date   RECTAL EXAM UNDER ANESTHESIA N/A 06/01/2020   Procedure: EXAM UNDER ANESTHESIA, REMOVAL OF FOREIGN BODY RECTUM;  Surgeon: Darnell Level, MD;  Location: [facility];  Service: General;  Laterality: N/A;    Family History  Problem Relation Age of Onset   Diabetes Mother    Arthritis Father    Hearing loss Father    Hypertension Father    Prostate cancer Neg Hx    Colon cancer Neg Hx     Medications- reviewed and updated Current Outpatient Medications  Medication Sig Dispense Refill   amphetamine-dextroamphetamine (ADDERALL) 30 MG tablet Take 1 tablet by mouth daily before breakfast. 30 tablet 0   ibuprofen (ADVIL,MOTRIN) 200 MG tablet Take 600-800 mg by mouth every 6 (six) hours as needed for mild pain.     meloxicam (MOBIC) 15 MG tablet Take 1 tablet (15 mg total) by mouth daily. 30 tablet 0   [START ON 05/07/2022] amphetamine-dextroamphetamine (ADDERALL) 30 MG tablet Take 1 tablet by mouth daily before breakfast. 30 tablet 0   amphetamine-dextroamphetamine (ADDERALL) 30 MG tablet Take 1 tablet by mouth daily before breakfast. 30 tablet 0   No current facility-administered medications for this visit.    Allergies-reviewed and updated Allergies  Allergen Reactions   Sulfa  Antibiotics Swelling    Social History   Socioeconomic History   Marital status: Married    Spouse name: Not on file   Number of children: Not on file   Years of education: Not on file   Highest education level: Not on file  Occupational History   Not on file  Tobacco Use   Smoking status: Former     Types: Cigarettes    Quit date: 06/24/2013    Years since quitting: 8.8   Smokeless tobacco: Never  Vaping Use   Vaping Use: Every day  Substance and Sexual Activity   Alcohol use: Yes   Drug use: Never   Sexual activity: Yes  Other Topics Concern   Not on file  Social History Narrative   Not on file   Social Determinants of Health   Financial Resource Strain: Not on file  Food Insecurity: Not on file  Transportation Needs: Not on file  Physical Activity: Not on file  Stress: Not on file  Social Connections: Not on file        Objective:  Physical Exam: BP (!) 141/95   Pulse 77   Temp 98.2 F (36.8 C) (Temporal)   Ht 6\' 4"  (1.93 m)   Wt 221 lb 9.6 oz (100.5 kg)   SpO2 99%   BMI 26.97 kg/m   Body mass index is 26.97 kg/m. Wt Readings from Last 3 Encounters:  04/27/22 221 lb 9.6 oz (100.5 kg)  03/08/22 224 lb (101.6 kg)  10/23/21 218 lb 9.6 oz (99.2 kg)   Gen: NAD, resting comfortably HEENT: TMs normal bilaterally. OP clear. No thyromegaly noted.  CV: RRR with no murmurs appreciated Pulm: NWOB, CTAB with no crackles, wheezes, or rhonchi GI: Normal bowel sounds present. Soft, Nontender, Nondistended. MSK: no edema, cyanosis, or clubbing noted Skin: warm, dry Neuro: CN2-12 grossly intact. Strength 5/5 in upper and lower extremities. Reflexes symmetric and intact bilaterally.  Psych: Normal affect and thought content     Aarti Mankowski M. Jerline Pain, MD 04/27/2022 8:25 AM

## 2022-05-01 NOTE — Progress Notes (Signed)
Please inform patient of the following:  Cholesterol little bit elevated but everything else is stable.  Do not need to start medications.  He should continue to work on diet and exercise and we can recheck in a year.

## 2022-05-28 ENCOUNTER — Other Ambulatory Visit: Payer: Self-pay | Admitting: Family Medicine

## 2022-05-28 NOTE — Telephone Encounter (Signed)
Caller states: -Pharmacy informed her that patient's adderall is on back order and they aren't sure when it will be in stock  - She found that they have adderall in stock at [facility] on [address], [place], [place] [address]  Caller requests: - amphetamine-dextroamphetamine (ADDERALL) 30 MG tablet be sent to [facility] on [address], [place] [address]

## 2022-05-29 MED ORDER — AMPHETAMINE-DEXTROAMPHETAMINE 30 MG PO TABS: 30.0000 mg | ORAL_TABLET | Freq: Every day | ORAL | 0 refills | Status: DC

## 2022-06-29 ENCOUNTER — Other Ambulatory Visit: Payer: Self-pay | Admitting: Family Medicine

## 2022-06-29 NOTE — Telephone Encounter (Signed)
  LAST APPOINTMENT DATE: 11/17/23r  NEXT APPOINTMENT DATE:  MEDICATION:  amphetamine-dextroamphetamine (ADDERALL) 30 MG tablet   Is the patient out of medication? Yes  PHARMACY:  [id number] - [place], [place] - [address] [place] [address] Phone: [phone]  Fax: [phone]      Let patient know to contact pharmacy at the end of the day to make sure medication is ready.  Please notify patient to allow 48-72 hours to process

## 2022-07-02 ENCOUNTER — Other Ambulatory Visit: Payer: Self-pay | Admitting: Family Medicine

## 2022-07-02 NOTE — Telephone Encounter (Signed)
  LAST APPOINTMENT DATE:  Please schedule appointment if longer than 1 year  04/27/22  NEXT APPOINTMENT DATE:  MEDICATION:  amphetamine-dextroamphetamine (ADDERALL) 30 MG tablet   Is the patient out of medication? Yes  PHARMACY:  [id number] - [place], [place] - [address] Phone: [phone]  Fax: [phone]      Let patient know to contact pharmacy at the end of the day to make sure medication is ready.  Please notify patient to allow 48-72 hours to process

## 2022-07-03 ENCOUNTER — Encounter: Payer: Self-pay | Admitting: Family Medicine

## 2022-07-03 MED ORDER — AMPHETAMINE-DEXTROAMPHETAMINE 30 MG PO TABS: 30.0000 mg | ORAL_TABLET | Freq: Every day | ORAL | 0 refills | Status: DC

## 2022-07-04 MED ORDER — AMPHETAMINE-DEXTROAMPHETAMINE 30 MG PO TABS: 30.0000 mg | ORAL_TABLET | Freq: Every day | ORAL | 0 refills | Status: DC

## 2022-08-20 ENCOUNTER — Other Ambulatory Visit: Payer: Self-pay | Admitting: Family Medicine

## 2022-08-21 MED ORDER — AMPHETAMINE-DEXTROAMPHETAMINE 30 MG PO TABS: 30.0000 mg | ORAL_TABLET | Freq: Every day | ORAL | 0 refills | Status: DC

## 2022-08-21 MED ORDER — MELOXICAM 15 MG PO TABS: 15.0000 mg | ORAL_TABLET | Freq: Every day | ORAL | 0 refills | Status: DC

## 2022-08-21 NOTE — Telephone Encounter (Signed)
Last refill: 07/04/21 #30, 0 Last OV: 04/27/22 dx. CPE

## 2022-10-02 ENCOUNTER — Other Ambulatory Visit: Payer: Self-pay | Admitting: Family Medicine

## 2022-10-03 ENCOUNTER — Other Ambulatory Visit: Payer: Self-pay | Admitting: Family Medicine

## 2022-10-04 MED ORDER — AMPHETAMINE-DEXTROAMPHETAMINE 30 MG PO TABS: 30.0000 mg | ORAL_TABLET | Freq: Every day | ORAL | 0 refills | Status: DC

## 2022-11-06 ENCOUNTER — Other Ambulatory Visit: Payer: Self-pay | Admitting: Family Medicine

## 2022-11-07 MED ORDER — AMPHETAMINE-DEXTROAMPHETAMINE 30 MG PO TABS: 30.0000 mg | ORAL_TABLET | Freq: Every day | ORAL | 0 refills | Status: DC

## 2022-11-07 NOTE — Telephone Encounter (Signed)
Needs visit for further refills.

## 2022-12-11 ENCOUNTER — Other Ambulatory Visit: Payer: Self-pay | Admitting: Family Medicine

## 2022-12-17 ENCOUNTER — Encounter: Payer: Self-pay | Admitting: Family Medicine

## 2022-12-17 NOTE — Telephone Encounter (Signed)
Patient already on schedule for 7/10 @ 7:40 am.

## 2022-12-17 NOTE — Telephone Encounter (Signed)
Patient need OV F/U medication refills

## 2022-12-19 ENCOUNTER — Encounter: Payer: Self-pay | Admitting: Family Medicine

## 2022-12-19 ENCOUNTER — Ambulatory Visit (INDEPENDENT_AMBULATORY_CARE_PROVIDER_SITE_OTHER): Payer: 59 | Admitting: Family Medicine

## 2022-12-19 VITALS — BP 139/90 | HR 91 | Temp 97.5°F | Ht 76.0 in | Wt 220.0 lb

## 2022-12-19 DIAGNOSIS — G8929 Other chronic pain: Secondary | ICD-10-CM

## 2022-12-19 DIAGNOSIS — F909 Attention-deficit hyperactivity disorder, unspecified type: Secondary | ICD-10-CM | POA: Diagnosis not present

## 2022-12-19 DIAGNOSIS — M549 Dorsalgia, unspecified: Secondary | ICD-10-CM | POA: Diagnosis not present

## 2022-12-19 MED ORDER — AMPHETAMINE-DEXTROAMPHETAMINE 30 MG PO TABS: 30.0000 mg | ORAL_TABLET | Freq: Every day | ORAL | 0 refills | Status: DC

## 2022-12-19 NOTE — Assessment & Plan Note (Signed)
Stable on meloxicam as needed.  Last refill was a couple months ago.  Does not need refill today.  No recent flares.

## 2022-12-19 NOTE — Patient Instructions (Signed)
It was very nice to see you today!  I will refill your medications today.  Please continue to work on diet and exercise.  Monitor your blood pressure and let us know if it is persistently elevated.  Return in about 6 months (around 06/21/2023) for Annual Physical.   Take care, Dr Jimmey Ralph  PLEASE NOTE:  If you had any lab tests, please let us know if you have not heard back within a few days. You may see your results on mychart before we have a chance to review them but we will give you a call once they are reviewed by [place].   If we ordered any referrals today, please let us know if you have not heard from their office within the next week.   If you had any urgent prescriptions sent in today, please check with the pharmacy within an hour of our visit to make sure the prescription was transmitted appropriately.   Please try these tips to maintain a healthy lifestyle:  Eat at least 3 REAL meals and 1-2 snacks per day.  Aim for no more than 5 hours between eating.  If you eat breakfast, please do so within one hour of getting up.   Each meal should contain half fruits/vegetables, one quarter protein, and one quarter carbs (no bigger than a computer mouse)  Cut down on sweet beverages. This includes juice, soda, and sweet tea.   Drink at least 1 glass of water with each meal and aim for at least 8 glasses per day  Exercise at least 150 minutes every week.

## 2022-12-19 NOTE — Assessment & Plan Note (Signed)
Database reviewed without red flags.  Doing well on Adderall 30 mg daily.  No significant side effects.  Medications help with ability to stay focused and on task at work.  Will refill today.  Follow-up in 6 months.

## 2022-12-19 NOTE — Progress Notes (Signed)
   Jason Murray is a 37 y.o. male who presents today for an office visit.  Assessment/Plan:  Chronic Problems Addressed Today: Adult ADHD Database reviewed without red flags.  Doing well on Adderall 30 mg daily.  No significant side effects.  Medications help with ability to stay focused and on task at work.  Will refill today.  Follow-up in 6 months.  Chronic back pain Stable on meloxicam as needed.  Last refill was a couple months ago.  Does not need refill today.  No recent flares.     Subjective:  HPI:  See Assessment / plan for status of chronic conditions.   Is doing well today.  No acute concerns.  Needs refill on Adderall.  He has been on this for many years.  Doing well with current dose.  Medications help with ability to stay focused and on task.  No significant side effects.       Objective:  Physical Exam: BP (!) 139/90   Pulse 91   Temp (!) 97.5 F (36.4 C) (Temporal)   Ht 6\' 4"  (1.93 m)   Wt 220 lb (99.8 kg)   SpO2 98%   BMI 26.78 kg/m   Gen: No acute distress, resting comfortably CV: Regular rate and rhythm with no murmurs appreciated Pulm: Normal work of breathing, clear to auscultation bilaterally with no crackles, wheezes, or rhonchi Neuro: Grossly normal, moves all extremities Psych: Normal affect and thought content      Jason Murray M. Jimmey Ralph, MD 12/19/2022 8:03 AM

## 2023-03-13 ENCOUNTER — Encounter: Payer: Self-pay | Admitting: Family Medicine

## 2023-03-13 ENCOUNTER — Ambulatory Visit (INDEPENDENT_AMBULATORY_CARE_PROVIDER_SITE_OTHER): Payer: 59 | Admitting: Family Medicine

## 2023-03-13 VITALS — BP 148/97 | HR 81 | Temp 98.2°F | Ht 76.0 in | Wt 222.0 lb

## 2023-03-13 DIAGNOSIS — N529 Male erectile dysfunction, unspecified: Secondary | ICD-10-CM | POA: Insufficient documentation

## 2023-03-13 DIAGNOSIS — F909 Attention-deficit hyperactivity disorder, unspecified type: Secondary | ICD-10-CM | POA: Diagnosis not present

## 2023-03-13 DIAGNOSIS — R030 Elevated blood-pressure reading, without diagnosis of hypertension: Secondary | ICD-10-CM | POA: Diagnosis not present

## 2023-03-13 MED ORDER — TADALAFIL 10 MG PO TABS: 5.0000 mg | ORAL_TABLET | ORAL | 1 refills | Status: DC | PRN

## 2023-03-13 NOTE — Patient Instructions (Addendum)
It was very nice to see you today!  We will start Cialis.  Please send me a message in a few weeks to let us know how this is working for you.  Please monitor your blood pressure at home and let us know if it is persistently elevated.  Return if symptoms worsen or fail to improve.   Take care, Dr Jimmey Ralph  PLEASE NOTE:  If you had any lab tests, please let us know if you have not heard back within a few days. You may see your results on mychart before we have a chance to review them but we will give you a call once they are reviewed by [place].   If we ordered any referrals today, please let us know if you have not heard from their office within the next week.   If you had any urgent prescriptions sent in today, please check with the pharmacy within an hour of our visit to make sure the prescription was transmitted appropriately.   Please try these tips to maintain a healthy lifestyle:  Eat at least 3 REAL meals and 1-2 snacks per day.  Aim for no more than 5 hours between eating.  If you eat breakfast, please do so within one hour of getting up.   Each meal should contain half fruits/vegetables, one quarter protein, and one quarter carbs (no bigger than a computer mouse)  Cut down on sweet beverages. This includes juice, soda, and sweet tea.   Drink at least 1 glass of water with each meal and aim for at least 8 glasses per day  Exercise at least 150 minutes every week.

## 2023-03-13 NOTE — Assessment & Plan Note (Signed)
Stable on Adderall 30 mg daily.  No significant side effects.  Does not need refill today.  Medications help with a bit of stay focused and on task at work.  He will follow-up with [place] in 3 months.

## 2023-03-13 NOTE — Progress Notes (Signed)
   Jason Murray is a 37 y.o. male who presents today for an office visit.  Assessment/Plan:  Chronic Problems Addressed Today: Erectile dysfunction I lengthy discussion with patient today regarding treatment options.  We did discuss checking testosterone however he does not think he is interested in hormone replacement therapy at this time and thus does not wish to check testosterone level at this point.  We discussed medication treatment options.  Will start Cialis 5 to 10 mg daily as needed.  We discussed potential side effects.  He will follow-up with [place] in a few weeks via MyChart.  Adult ADHD Stable on Adderall 30 mg daily.  No significant side effects.  Does not need refill today.  Medications help with a bit of stay focused and on task at work.  He will follow-up with [place] in 3 months.  Elevated blood pressure reading Blood pressure slightly above goal.  At goal last office visit.  He will continue to monitor at home and let us know if persistently elevated.  Consider trial of amlodipine if persistently elevated at home or at future office visits.     Subjective:  HPI:  See Assessment / plan for status of chronic conditions.   Patient here with concern for erectile dysfunction. This has been going for several months. Staying about the same over that time. Sometimes has a hard time initiating an erection and sometimes has trouble maintaining erection.  No obvious injuries or precipitating events.  No pain.  No discharge.  No dysuria.       Objective:  Physical Exam: BP (!) 148/97   Pulse 81   Temp 98.2 F (36.8 C) (Temporal)   Ht 6\' 4"  (1.93 m)   Wt 222 lb (100.7 kg)   SpO2 91%   BMI 27.02 kg/m   Gen: No acute distress, resting comfortably Neuro: Grossly normal, moves all extremities Psych: Normal affect and thought content      Marissia Blackham M. Jimmey Ralph, MD 03/13/2023 7:54 AM

## 2023-03-13 NOTE — Assessment & Plan Note (Signed)
I lengthy discussion with patient today regarding treatment options.  We did discuss checking testosterone however he does not think he is interested in hormone replacement therapy at this time and thus does not wish to check testosterone level at this point.  We discussed medication treatment options.  Will start Cialis 5 to 10 mg daily as needed.  We discussed potential side effects.  He will follow-up with [place] in a few weeks via MyChart.

## 2023-03-13 NOTE — Assessment & Plan Note (Signed)
Blood pressure slightly above goal.  At goal last office visit.  He will continue to monitor at home and let us know if persistently elevated.  Consider trial of amlodipine if persistently elevated at home or at future office visits.

## 2023-03-25 ENCOUNTER — Other Ambulatory Visit: Payer: Self-pay | Admitting: Family Medicine

## 2023-03-26 MED ORDER — AMPHETAMINE-DEXTROAMPHETAMINE 30 MG PO TABS: 30.0000 mg | ORAL_TABLET | Freq: Every day | ORAL | 0 refills | Status: DC

## 2023-03-28 ENCOUNTER — Telehealth: Payer: Self-pay

## 2023-03-28 ENCOUNTER — Other Ambulatory Visit (HOSPITAL_COMMUNITY): Payer: Self-pay

## 2023-03-28 NOTE — Telephone Encounter (Signed)
Pharmacy Patient Advocate Encounter   Received notification from CoverMyMeds that prior authorization for DEXTROAMP-AMPHETAMIN 30 MG TAB is required/requested.   Insurance verification completed.   The patient is insured through [facility] .   Per test claim: PA required; PA submitted to [facility] via Prompt PA Key/confirmation #/[id number] Status is pending

## 2023-03-29 NOTE — Telephone Encounter (Signed)
Pharmacy Patient Advocate Encounter  Received notification from [facility] that Prior Authorization for Dextroamphetamine-amphetamine has been APPROVED from 03/28/2023 to 03/26/2025   PA #/Case ID/Reference #: [id number]

## 2023-04-27 ENCOUNTER — Other Ambulatory Visit: Payer: Self-pay | Admitting: Family Medicine

## 2023-04-29 MED ORDER — AMPHETAMINE-DEXTROAMPHETAMINE 30 MG PO TABS: 30.0000 mg | ORAL_TABLET | Freq: Every day | ORAL | 0 refills | Status: DC

## 2023-05-15 ENCOUNTER — Telehealth: Payer: Self-pay | Admitting: Family Medicine

## 2023-05-15 NOTE — Telephone Encounter (Signed)
Prescription Request  05/15/2023  LOV: 03/13/2023  What is the name of the medication or equipment? tadalafil (CIALIS) 10 MG tablet   Have you contacted your pharmacy to request a refill? Yes   Which pharmacy would you like this sent to?    [id number] [place], [place] - [address] [place] [address] Phone: [phone] Fax: [phone]    Patient notified that their request is being sent to the clinical staff for review and that they should receive a response within 2 business days.   Please advise at Mobile [phone] (mobile)

## 2023-05-16 ENCOUNTER — Other Ambulatory Visit: Payer: Self-pay | Admitting: *Deleted

## 2023-05-16 MED ORDER — TADALAFIL 10 MG PO TABS: 5.0000 mg | ORAL_TABLET | ORAL | 1 refills | Status: DC | PRN

## 2023-05-16 NOTE — Telephone Encounter (Signed)
[facility]  

## 2023-05-31 ENCOUNTER — Other Ambulatory Visit: Payer: Self-pay | Admitting: Family Medicine

## 2023-05-31 MED ORDER — AMPHETAMINE-DEXTROAMPHETAMINE 30 MG PO TABS: 30.0000 mg | ORAL_TABLET | Freq: Every day | ORAL | 0 refills | Status: DC

## 2023-06-19 ENCOUNTER — Encounter: Payer: 59 | Admitting: Family Medicine

## 2023-06-30 ENCOUNTER — Other Ambulatory Visit: Payer: Self-pay | Admitting: Family Medicine

## 2023-07-02 ENCOUNTER — Other Ambulatory Visit: Payer: Self-pay | Admitting: Family Medicine

## 2023-07-02 MED ORDER — AMPHETAMINE-DEXTROAMPHETAMINE 30 MG PO TABS: 30.0000 mg | ORAL_TABLET | Freq: Every day | ORAL | 0 refills | Status: DC

## 2023-07-02 MED ORDER — TADALAFIL 10 MG PO TABS: 5.0000 mg | ORAL_TABLET | ORAL | 1 refills | Status: AC | PRN

## 2023-07-02 NOTE — Telephone Encounter (Signed)
Copied from CRM [id number]. Topic: Clinical - Medication Refill >> Jul 02, 2023 12:00 PM Kathryne Eriksson wrote: Most Recent Primary Care Visit:  Provider: Ardith Dark  Department: [facility]  Visit Type: OFFICE VISIT  Date: 03/13/2023  Medication: amphetamine-dextroamphetamine (ADDERALL) 30 MG tablet , tadalafil (CIALIS) 10 MG tablet   Has the patient contacted their pharmacy? Yes (Agent: If no, request that the patient contact the pharmacy for the refill. If patient does not wish to contact the pharmacy document the reason why and proceed with request.) (Agent: If yes, when and what did the pharmacy advise?)  Is this the correct pharmacy for this prescription? Yes If no, delete pharmacy and type the correct one.  This is the patient's preferred pharmacy:    [id number] [place], [place] - [address] [place] [address] Phone: [phone] Fax: [phone]   Has the prescription been filled recently? No  Is the patient out of the medication? Yes  Has the patient been seen for an appointment in the last year OR does the patient have an upcoming appointment? Yes  Can we respond through MyChart? Yes  Agent: Please be advised that Rx refills may take up to 3 business days. We ask that you follow-up with your pharmacy.

## 2023-07-02 NOTE — Telephone Encounter (Signed)
Duplicate request. Medication request already routed to office earlier today.  Copied from CRM [id number]. Topic: Clinical - Medication Refill >> Jul 02, 2023 12:00 PM Kathryne Eriksson wrote: Most Recent Primary Care Visit:  Provider: Ardith Dark  Department: [facility]  Visit Type: OFFICE VISIT  Date: 03/13/2023  Medication:  amphetamine-dextroamphetamine (ADDERALL) 30 MG tablet , tadalafil (CIALIS) 10 MG tablet  Has the patient contacted their pharmacy? Yes (Agent: If no, request that the patient contact the pharmacy for the refill. If patient does not wish to contact the pharmacy document the reason why and proceed with request.) (Agent: If yes, when and what did the pharmacy advise?)  Is this the correct pharmacy for this prescription? Yes If no, delete pharmacy and type the correct one.  This is the patient's preferred pharmacy:   [id number] [place], [place] - [address] [place] [address] Phone: [phone] Fax: [phone]   Has the prescription been filled recently? No  Is the patient out of the medication? Yes  Has the patient been seen for an appointment in the last year OR does the patient have an upcoming appointment? Yes  Can we respond through MyChart? Yes  Agent: Please be advised that Rx refills may take up to 3 business days. We ask that you follow-up with your pharmacy.

## 2023-08-05 ENCOUNTER — Other Ambulatory Visit: Payer: Self-pay | Admitting: Family Medicine

## 2023-08-06 MED ORDER — AMPHETAMINE-DEXTROAMPHETAMINE 30 MG PO TABS: 30.0000 mg | ORAL_TABLET | Freq: Every day | ORAL | 0 refills | Status: DC

## 2023-08-06 NOTE — Telephone Encounter (Signed)
 Last OV: 03/13/23  Next OV: none  Last FIlled: 07/02/23  Quantity: 30

## 2023-09-02 ENCOUNTER — Other Ambulatory Visit: Payer: Self-pay | Admitting: Family Medicine

## 2023-09-02 MED ORDER — AMPHETAMINE-DEXTROAMPHETAMINE 30 MG PO TABS: 30.0000 mg | ORAL_TABLET | Freq: Every day | ORAL | 0 refills | Status: DC

## 2023-10-07 ENCOUNTER — Other Ambulatory Visit: Payer: Self-pay | Admitting: Family Medicine

## 2023-10-07 MED ORDER — AMPHETAMINE-DEXTROAMPHETAMINE 30 MG PO TABS: 30.0000 mg | ORAL_TABLET | Freq: Every day | ORAL | 0 refills | Status: DC

## 2023-10-07 NOTE — Telephone Encounter (Signed)
Needs office visit for further refills

## 2023-11-08 ENCOUNTER — Other Ambulatory Visit: Payer: Self-pay | Admitting: Family Medicine

## 2023-11-08 MED ORDER — AMPHETAMINE-DEXTROAMPHETAMINE 30 MG PO TABS: 30.0000 mg | ORAL_TABLET | Freq: Every day | ORAL | 0 refills | Status: DC

## 2023-11-08 NOTE — Telephone Encounter (Signed)
 Copied from CRM [id number]. Topic: Clinical - Medication Refill >> Nov 08, 2023 12:58 PM Howard Macho wrote: Medication: amphetamine -dextroamphetamine  (ADDERALL) 30 MG tablet  Has the patient contacted their pharmacy? Yes (Agent: If no, request that the patient contact the pharmacy for the refill. If patient does not wish to contact the pharmacy document the reason why and proceed with request.) (Agent: If yes, when and what did the pharmacy advise?)  This is the patient's preferred pharmacy:  [id number] [place], [place] - [address] [place] [address] Phone: [phone] Fax: [phone]  Is this the correct pharmacy for this prescription? Yes If no, delete pharmacy and type the correct one.   Has the prescription been filled recently? No  Is the patient out of the medication? Yes  Has the patient been seen for an appointment in the last year OR does the patient have an upcoming appointment? Yes  Can we respond through MyChart? Yes  Agent: Please be advised that Rx refills may take up to 3 business days. We ask that you follow-up with your pharmacy.

## 2023-11-08 NOTE — Telephone Encounter (Signed)
 03/13/2023 LOV  10/07/2023  30/0 refills

## 2023-12-10 ENCOUNTER — Telehealth: Payer: Self-pay | Admitting: Family Medicine

## 2023-12-10 ENCOUNTER — Other Ambulatory Visit: Payer: Self-pay | Admitting: Family Medicine

## 2023-12-10 NOTE — Telephone Encounter (Unsigned)
 Copied from CRM [id number]. Topic: Clinical - Medication Refill >> Dec 10, 2023  2:42 PM Franky GRADE wrote: Medication: amphetamine -dextroamphetamine  (ADDERALL) 30 MG tablet [[id number]]  Has the patient contacted their pharmacy? No (Agent: If no, request that the patient contact the pharmacy for the refill. If patient does not wish to contact the pharmacy document the reason why and proceed with request.) (Agent: If yes, when and what did the pharmacy advise?)  This is the patient's preferred pharmacy:  [id number] [place], [place] - [address] [place] [address] Phone: [phone] Fax: [phone]   Is this the correct pharmacy for this prescription? Yes If no, delete pharmacy and type the correct one.   Has the prescription been filled recently? No  Is the patient out of the medication? Yes  Has the patient been seen for an appointment in the last year OR does the patient have an upcoming appointment? Yes  Can we respond through MyChart? Yes  Agent: Please be advised that Rx refills may take up to 3 business days. We ask that you follow-up with your pharmacy.

## 2023-12-10 NOTE — Telephone Encounter (Signed)
 Pt requesting refill for Adderall 30 mg . Last OV 03/13/2023.

## 2023-12-17 ENCOUNTER — Other Ambulatory Visit: Payer: Self-pay | Admitting: Family Medicine

## 2023-12-17 NOTE — Telephone Encounter (Signed)
 Copied from CRM [id number]. Topic: Clinical - Medication Refill >> Dec 10, 2023  2:42 PM Jason Murray wrote: Medication: amphetamine -dextroamphetamine  (ADDERALL) 30 MG tablet [[id number]]  Has the patient contacted their pharmacy? No (Agent: If no, request that the patient contact the pharmacy for the refill. If patient does not wish to contact the pharmacy document the reason why and proceed with request.) (Agent: If yes, when and what did the pharmacy advise?)  This is the patient's preferred pharmacy:  [id number] [place], [place] - [address] Jason [place] [address] Phone: [phone] Fax: [phone]   Is this the correct pharmacy for this prescription? Yes If no, delete pharmacy and type the correct one.   Has the prescription been filled recently? No  Is the patient out of the medication? Yes  Has the patient been seen for an appointment in the last year OR does the patient have an upcoming appointment? Yes  Can we respond through MyChart? Yes  Agent: Please be advised that Rx refills may take up to 3 business days. We ask that you follow-up with your pharmacy. >> Dec 17, 2023  1:42 PM Jason Murray wrote: Patient called stating he is out of his medication and he is checking on the refill [phone]

## 2023-12-20 ENCOUNTER — Ambulatory Visit (INDEPENDENT_AMBULATORY_CARE_PROVIDER_SITE_OTHER): Admitting: Family Medicine

## 2023-12-20 VITALS — BP 149/97 | HR 84 | Temp 97.7°F | Ht 76.0 in | Wt 237.0 lb

## 2023-12-20 DIAGNOSIS — F909 Attention-deficit hyperactivity disorder, unspecified type: Secondary | ICD-10-CM | POA: Diagnosis not present

## 2023-12-20 DIAGNOSIS — R030 Elevated blood-pressure reading, without diagnosis of hypertension: Secondary | ICD-10-CM

## 2023-12-20 MED ORDER — AMPHETAMINE-DEXTROAMPHETAMINE 30 MG PO TABS: 30.0000 mg | ORAL_TABLET | Freq: Every day | ORAL | 0 refills | Status: DC

## 2023-12-20 MED ORDER — AMPHETAMINE-DEXTROAMPHETAMINE 10 MG PO TABS: 10.0000 mg | ORAL_TABLET | Freq: Every day | ORAL | 0 refills | Status: DC | PRN

## 2023-12-20 NOTE — Assessment & Plan Note (Signed)
 Symptoms are not fully controlled on Adderall 30 mg daily.  He has been tolerating well without any significant side effects though is interested in adjusting the dose of medications.  We did discuss potential treatment options including increasing dose, switching to extended release or trial of alternative medication such as Vyvanse.  We will send a prescription in for an extra Adderall 10 mg daily in the afternoon as needed.  He will continue his Adderall 30 mg in the morning.  He will follow-up with us  in a week or see him on MyChart and adjust as needed.  Also did advise patient to use MyChart messages going forward to her request refills and to make sure that he has routine 3 to 54-month follow-up visit scheduled.

## 2023-12-20 NOTE — Progress Notes (Signed)
   Jason Murray is a 38 y.o. male who presents today for an office visit.  Assessment/Plan:  Chronic Problems Addressed Today: Adult ADHD Symptoms are not fully controlled on Adderall 30 mg daily.  He has been tolerating well without any significant side effects though is interested in adjusting the dose of medications.  We did discuss potential treatment options including increasing dose, switching to extended release or trial of alternative medication such as Vyvanse.  We will send a prescription in for an extra Adderall 10 mg daily in the afternoon as needed.  He will continue his Adderall 30 mg in the morning.  He will follow-up with us  in a week or see him on MyChart and adjust as needed.  Also did advise patient to use MyChart messages going forward to her request refills and to make sure that he has routine 3 to 75-month follow-up visit scheduled.  Elevated blood pressure reading Elevated today though typically has been well-controlled.  He has been under quite a bit of stress recently due to going through a separation with his wife.  He will continue monitor at home and let us  know if persistently elevated.  We did discuss support to help him through stressful time.  He is not interested in referral to see a therapist at this point.  He will let us  know if he changes mind.     Subjective:  HPI:  See A/P for status of chronic conditions.  Patient is here today for ADHD follow-up.  Was initially scheduled as CPE however he would like to hold off on this for now as he may be changing insurances.  He is currently on Adderall 30 mg daily.  He tolerates this well though does feel like the effectiveness wears off a bit quicker than he would like.  He has tried other medications in the past including externally his Adderall with much benefit.  He is interested in trying a different dose.  He does admit to being under more stress recently as he is currently going through separation with his  wife.        Objective:  Physical Exam: BP (!) 149/97   Pulse 84   Temp 97.7 F (36.5 C) (Temporal)   Ht 6' 4 (1.93 m)   Wt 237 lb (107.5 kg)   SpO2 99%   BMI 28.85 kg/m   Gen: No acute distress, resting comfortably CV: Regular rate and rhythm with no murmurs appreciated Pulm: Normal work of breathing, clear to auscultation bilaterally with no crackles, wheezes, or rhonchi Neuro: Grossly normal, moves all extremities Psych: Normal affect and thought content      Jason Murray M. Kennyth, MD 12/20/2023 9:28 AM

## 2023-12-20 NOTE — Assessment & Plan Note (Signed)
 Elevated today though typically has been well-controlled.  He has been under quite a bit of stress recently due to going through a separation with his wife.  He will continue monitor at home and let us  know if persistently elevated.  We did discuss support to help him through stressful time.  He is not interested in referral to see a therapist at this point.  He will let us  know if he changes mind.

## 2023-12-20 NOTE — Patient Instructions (Signed)
 It was very nice to see you today!  We will refill your Adderall 30 mg.  I will also send in a prescription for Adderall 10 mg daily in afternoon as needed.  Please send me a message in a week or so to let me know how this is working for you.  Return in about 6 months (around 06/21/2024) for Follow Up.   Take care, Dr Kennyth  PLEASE NOTE:  If you had any lab tests, please let us  know if you have not heard back within a few days. You may see your results on mychart before we have a chance to review them but we will give you a call once they are reviewed by us .   If we ordered any referrals today, please let us  know if you have not heard from their office within the next week.   If you had any urgent prescriptions sent in today, please check with the pharmacy within an hour of our visit to make sure the prescription was transmitted appropriately.   Please try these tips to maintain a healthy lifestyle:  Eat at least 3 REAL meals and 1-2 snacks per day.  Aim for no more than 5 hours between eating.  If you eat breakfast, please do so within one hour of getting up.   Each meal should contain half fruits/vegetables, one quarter protein, and one quarter carbs (no bigger than a computer mouse)  Cut down on sweet beverages. This includes juice, soda, and sweet tea.   Drink at least 1 glass of water with each meal and aim for at least 8 glasses per day  Exercise at least 150 minutes every week.

## 2023-12-26 ENCOUNTER — Encounter: Payer: Self-pay | Admitting: Family Medicine

## 2023-12-26 MED ORDER — AMPHETAMINE-DEXTROAMPHETAMINE 10 MG PO TABS: 10.0000 mg | ORAL_TABLET | Freq: Every day | ORAL | 0 refills | Status: DC | PRN

## 2023-12-26 NOTE — Telephone Encounter (Signed)
 I appreciate the update.  I am glad to hear that his new regimen is working well.  I will send in a 30-day supply.  He should let us  know when he needs a refill.  We should see him back in 3 months to recheck.

## 2024-01-17 ENCOUNTER — Encounter: Payer: Self-pay | Admitting: Family Medicine

## 2024-01-20 MED ORDER — AMPHETAMINE-DEXTROAMPHETAMINE 30 MG PO TABS: 30.0000 mg | ORAL_TABLET | Freq: Every day | ORAL | 0 refills | Status: DC

## 2024-01-20 MED ORDER — AMPHETAMINE-DEXTROAMPHETAMINE 10 MG PO TABS: 10.0000 mg | ORAL_TABLET | Freq: Every day | ORAL | 0 refills | Status: DC | PRN

## 2024-02-19 ENCOUNTER — Encounter: Payer: Self-pay | Admitting: Family Medicine

## 2024-02-19 MED ORDER — AMPHETAMINE-DEXTROAMPHETAMINE 30 MG PO TABS: 30.0000 mg | ORAL_TABLET | Freq: Every day | ORAL | 0 refills | Status: DC

## 2024-02-19 MED ORDER — AMPHETAMINE-DEXTROAMPHETAMINE 10 MG PO TABS: 10.0000 mg | ORAL_TABLET | Freq: Every day | ORAL | 0 refills | Status: DC | PRN

## 2024-03-06 ENCOUNTER — Encounter: Payer: Self-pay | Admitting: Family Medicine

## 2024-03-09 MED ORDER — AMPHETAMINE-DEXTROAMPHETAMINE 10 MG PO TABS: 10.0000 mg | ORAL_TABLET | Freq: Every day | ORAL | 0 refills | Status: DC | PRN

## 2024-03-18 ENCOUNTER — Encounter: Payer: Self-pay | Admitting: Family Medicine

## 2024-03-19 MED ORDER — AMPHETAMINE-DEXTROAMPHETAMINE 30 MG PO TABS: 30.0000 mg | ORAL_TABLET | Freq: Every day | ORAL | 0 refills | Status: DC

## 2024-04-07 ENCOUNTER — Encounter: Payer: Self-pay | Admitting: Family Medicine

## 2024-04-08 MED ORDER — AMPHETAMINE-DEXTROAMPHETAMINE 10 MG PO TABS: 10.0000 mg | ORAL_TABLET | Freq: Every day | ORAL | 0 refills | Status: DC | PRN

## 2024-04-08 NOTE — Addendum Note (Signed)
 Addended by: IDA ELORA HERO on: 04/08/2024 08:41 AM   Modules accepted: Orders

## 2024-04-08 NOTE — Telephone Encounter (Signed)
 Can we clarify what dose he needs? His message says 10 mg but I was routed the 30 mg dose.

## 2024-04-20 ENCOUNTER — Encounter: Payer: Self-pay | Admitting: Family Medicine

## 2024-04-20 MED ORDER — AMPHETAMINE-DEXTROAMPHETAMINE 30 MG PO TABS: 30.0000 mg | ORAL_TABLET | Freq: Every day | ORAL | 0 refills | Status: DC

## 2024-05-19 ENCOUNTER — Encounter: Payer: Self-pay | Admitting: Family Medicine

## 2024-05-19 MED ORDER — AMPHETAMINE-DEXTROAMPHETAMINE 30 MG PO TABS: 30.0000 mg | ORAL_TABLET | Freq: Every day | ORAL | 0 refills | Status: DC

## 2024-05-19 MED ORDER — AMPHETAMINE-DEXTROAMPHETAMINE 10 MG PO TABS: 10.0000 mg | ORAL_TABLET | Freq: Every day | ORAL | 0 refills | Status: DC | PRN

## 2024-05-25 ENCOUNTER — Other Ambulatory Visit (HOSPITAL_COMMUNITY): Payer: Self-pay

## 2024-05-27 ENCOUNTER — Other Ambulatory Visit (HOSPITAL_COMMUNITY): Payer: Self-pay

## 2024-05-27 ENCOUNTER — Telehealth: Payer: Self-pay

## 2024-05-27 NOTE — Telephone Encounter (Addendum)
 Pharmacy Patient Advocate Encounter   Received notification from CoverMyMeds that prior authorization for Adderall 10mg  tabs is required/requested.   Insurance verification completed.   The patient is insured through [employer].   Per test claim: PA required and submitted KEY/EOC/Request #: [id number] CANCELLED due to insurance coverage terminated.Jason Murray

## 2024-05-28 ENCOUNTER — Other Ambulatory Visit (HOSPITAL_COMMUNITY): Payer: Self-pay

## 2024-06-18 ENCOUNTER — Other Ambulatory Visit (HOSPITAL_COMMUNITY): Payer: Self-pay

## 2024-06-18 ENCOUNTER — Encounter: Payer: Self-pay | Admitting: Family Medicine

## 2024-06-18 MED ORDER — AMPHETAMINE-DEXTROAMPHETAMINE 10 MG PO TABS: 10.0000 mg | ORAL_TABLET | Freq: Every day | ORAL | 0 refills | Status: DC | PRN

## 2024-06-18 NOTE — Telephone Encounter (Signed)
Patient notified of medication approval via MyChart 

## 2024-06-18 NOTE — Telephone Encounter (Signed)
 Please advise

## 2024-06-19 NOTE — Telephone Encounter (Signed)
 Noted

## 2024-06-22 ENCOUNTER — Encounter: Payer: Self-pay | Admitting: Family Medicine

## 2024-06-22 ENCOUNTER — Ambulatory Visit (INDEPENDENT_AMBULATORY_CARE_PROVIDER_SITE_OTHER): Payer: Self-pay | Admitting: Family Medicine

## 2024-06-22 VITALS — BP 148/102 | HR 96 | Temp 98.2°F | Ht 76.0 in | Wt 243.6 lb

## 2024-06-22 DIAGNOSIS — N529 Male erectile dysfunction, unspecified: Secondary | ICD-10-CM

## 2024-06-22 DIAGNOSIS — R030 Elevated blood-pressure reading, without diagnosis of hypertension: Secondary | ICD-10-CM

## 2024-06-22 DIAGNOSIS — F909 Attention-deficit hyperactivity disorder, unspecified type: Secondary | ICD-10-CM | POA: Diagnosis not present

## 2024-06-22 MED ORDER — AMPHETAMINE-DEXTROAMPHETAMINE 30 MG PO TABS: 30.0000 mg | ORAL_TABLET | Freq: Every day | ORAL | 0 refills | Status: AC

## 2024-06-22 MED ORDER — AMPHETAMINE-DEXTROAMPHETAMINE 10 MG PO TABS: 10.0000 mg | ORAL_TABLET | Freq: Every day | ORAL | 0 refills | Status: AC | PRN

## 2024-06-22 MED ORDER — MELOXICAM 15 MG PO TABS: 15.0000 mg | ORAL_TABLET | Freq: Every day | ORAL | 0 refills | Status: AC

## 2024-06-22 NOTE — Progress Notes (Signed)
 "  Jason Murray is a 39 y.o. male who presents today for an office visit.  Assessment/Plan:  New/Acute Problems: Left Shoulder Pain  Patient with left shoulder pain for the last few months.  Exam today with positive Hawkins and near but no pain elicited with internal rotation, external rotation, or resisted supraspinatus testing.  I have rotator cuff impingement syndrome.  We discussed treatment options.  Will start meloxicam  15 mg daily for the next couple of weeks.  Also discussed home exercise program and handout was given.  We did discuss imaging and referral to PT or sports medicine however hold off on this for now.  He will follow-up with us  in a few weeks and would consider imaging versus referral if not improving at that time.  Chronic Problems Addressed Today: Adult ADHD Symptoms are stable on current regimen Adderall 30 mg in the morning and extra 10 mg in the afternoon.  Will refill today so give a small supply of the Adderall 10 mg to align his prescriptions at the pharmacy.  He will follow-up with us  in a few weeks via MyChart.  Come back in 3 to 6 months for CPE and we can check labs at that time.  Erectile dysfunction He is now going through [facility] .  Does not need any refills today.for   Elevated blood pressure reading Mildly elevated today.  He has been under quite a bit of stress recently.  Will continue to monitor for now and he will monitor at home.  He has typically been well-controlled.  He will monitor at home and let us  know if persistently elevated.  He will come back here in 3 to 6 months for annual physical.  If still elevated at that time would consider starting antihypertensive therapy.     Subjective:  HPI:   Discussed the use of AI scribe software for clinical note transcription with the patient, who gave verbal consent to proceed.  History of Present Illness Jason Murray is a 39 year old male who presents with ADHD medication management and left  shoulder pain.  He is here for his six-month follow-up to discuss ADHD medication management. His current regimen includes Adderall 10 mg and 30 mg, which he finds effective. He is experiencing issues with pharmacy coordination, requiring an adjustment to align the refill dates of his prescriptions. He recently picked up the 10 mg dose and will need four extra pills to synchronize with the 30 mg dose, which is due on the fifteenth of the month.  He reports left shoulder pain that began approximately four months ago after playing golf. Initially, he thought he pulled something in his shoulder. The pain is intermittent, occurring only with certain movements, similar to 'when you have a kink in your neck.' It does not hurt at rest but is exacerbated by specific positions. He played golf again recently, which aggravated the pain, although it felt different from the initial episode. The pain typically lasts about two weeks before subsiding. He manages the pain with ibuprofen and stretching exercises, noting that the discomfort feels like it is in the deltoid muscle area.  He is currently taking meloxicam  for back pain but has not used it for his shoulder pain. The shoulder pain is located under the deltoid muscle, with no radiation. He has not sought any additional treatments or diagnostic studies for the shoulder pain.         Objective:  Physical Exam: BP (!) 148/102   Pulse 96  Temp 98.2 F (36.8 C) (Temporal)   Ht 6' 4 (1.93 m)   Wt 243 lb 9.6 oz (110.5 kg)   SpO2 98%   BMI 29.65 kg/m   Gen: No acute distress, resting comfortably CV: Regular rate and rhythm with no murmurs appreciated Pulm: Normal work of breathing, clear to auscultation bilaterally with no crackles, wheezes, or rhonchi Musculoskeletal: - Left shoulder: No deformities.  Full range of motion throughout.  No pain with resisted supraspinatus, internal rotation, or external rotation.  No pain along biceps tendon.  Pain  elicited with Neer and Hawkins test. Neuro: Grossly normal, moves all extremities Psych: Normal affect and thought content      Jason Murray M. Kennyth, MD 06/22/2024 9:09 AM  "

## 2024-06-22 NOTE — Patient Instructions (Signed)
 It was very nice to see you today!  VISIT SUMMARY: During your visit, we discussed your ADHD medication management and left shoulder pain. We also noted slightly elevated blood pressure.  YOUR PLAN: ADULT ATTENTION-DEFICIT HYPERACTIVITY DISORDER: Your ADHD is well-managed with Adderall, but there are issues with aligning your prescription refills. -[facility] 10 [facility]. -An extra four 10 mg Adderall were sent to align prescription refills. -A full prescription for Adderall 10 mg and 30 mg was sent to be kept on file.  IMPINGEMENT SYNDROME OF LEFT SHOULDER: Your left shoulder pain is likely due to rotator cuff impingement, which is exacerbated by certain movements. -Take meloxicam  for a couple of weeks to reduce inflammation. -Follow the printed instructions for shoulder stretches and exercises. -Report back in a couple of weeks if symptoms persist or worsen.  ELEVATED BLOOD PRESSURE: Your blood pressure is slightly elevated, likely due to hereditary factors. -Continue to monitor your blood pressure regularly. -Blood work is planned for your next visit in the summer.  Return in about 6 months (around 12/20/2024) for Annual Physical.   Take care, Dr Kennyth  PLEASE NOTE:  If you had any lab tests, please let us  know if you have not heard back within a few days. You may see your results on mychart before we have a chance to review them but we will give you a call once they are reviewed by us .   If we ordered any referrals today, please let us  know if you have not heard from their office within the next week.   If you had any urgent prescriptions sent in today, please check with the pharmacy within an hour of our visit to make sure the prescription was transmitted appropriately.   Please try these tips to maintain a healthy lifestyle:  Eat at least 3 REAL meals and 1-2 snacks per day.  Aim for no more than 5 hours between eating.   If you eat breakfast, please do so within one hour of getting up.   Each meal should contain half fruits/vegetables, one quarter protein, and one quarter carbs (no bigger than a computer mouse)  Cut down on sweet beverages. This includes juice, soda, and sweet tea.   Drink at least 1 glass of water with each meal and aim for at least 8 glasses per day  Exercise at least 150 minutes every week.

## 2024-06-22 NOTE — Assessment & Plan Note (Signed)
 He is now going through [facility] .  Does not need any refills today.for

## 2024-06-22 NOTE — Assessment & Plan Note (Addendum)
 Mildly elevated today.  He has been under quite a bit of stress recently.  Will continue to monitor for now and he will monitor at home.  He has typically been well-controlled.  He will monitor at home and let us  know if persistently elevated.  He will come back here in 3 to 6 months for annual physical.  If still elevated at that time would consider starting antihypertensive therapy.

## 2024-06-22 NOTE — Assessment & Plan Note (Signed)
 Symptoms are stable on current regimen Adderall 30 mg in the morning and extra 10 mg in the afternoon.  Will refill today so give a small supply of the Adderall 10 mg to align his prescriptions at the pharmacy.  He will follow-up with us  in a few weeks via MyChart.  Come back in 3 to 6 months for CPE and we can check labs at that time.
